# Patient Record
Sex: Female | Born: 1995 | Hispanic: Yes | Marital: Married | Smoking: Never smoker
Health system: Southern US, Community
[De-identification: ages and names within clinical notes are randomized; demographics above are authoritative.]

## PROBLEM LIST (undated history)

## (undated) DIAGNOSIS — D649 Anemia, unspecified: Secondary | ICD-10-CM

## (undated) HISTORY — DX: Anemia, unspecified: D64.9

---
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
## 2016-08-14 ENCOUNTER — Other Ambulatory Visit: Payer: Self-pay | Admitting: Physician Assistant

## 2016-08-14 DIAGNOSIS — Z3483 Encounter for supervision of other normal pregnancy, third trimester: Secondary | ICD-10-CM

## 2016-08-22 ENCOUNTER — Other Ambulatory Visit: Payer: Self-pay | Admitting: Physician Assistant

## 2016-08-22 ENCOUNTER — Ambulatory Visit
Admission: RE | Admit: 2016-08-22 | Discharge: 2016-08-22 | Disposition: A | Discharge status: Home / Self Care | Payer: Medicaid Other | Source: Ambulatory Visit | Attending: Physician Assistant | Admitting: Physician Assistant

## 2016-08-22 DIAGNOSIS — Z3483 Encounter for supervision of other normal pregnancy, third trimester: Secondary | ICD-10-CM | POA: Diagnosis present

## 2016-08-22 DIAGNOSIS — Z3A30 30 weeks gestation of pregnancy: Secondary | ICD-10-CM | POA: Insufficient documentation

## 2016-10-07 ENCOUNTER — Other Ambulatory Visit: Payer: Self-pay | Admitting: Advanced Practice Midwife

## 2016-10-07 DIAGNOSIS — Z3483 Encounter for supervision of other normal pregnancy, third trimester: Secondary | ICD-10-CM

## 2016-10-09 ENCOUNTER — Other Ambulatory Visit: Payer: Self-pay | Admitting: Advanced Practice Midwife

## 2016-10-10 ENCOUNTER — Other Ambulatory Visit: Payer: Self-pay | Admitting: Advanced Practice Midwife

## 2016-10-10 ENCOUNTER — Ambulatory Visit: Admission: RE | Admit: 2016-10-10 | Payer: Self-pay | Source: Ambulatory Visit

## 2016-10-10 DIAGNOSIS — O26843 Uterine size-date discrepancy, third trimester: Secondary | ICD-10-CM

## 2016-10-10 DIAGNOSIS — Z3483 Encounter for supervision of other normal pregnancy, third trimester: Secondary | ICD-10-CM

## 2016-10-23 ENCOUNTER — Inpatient Hospital Stay
Admission: EM | Admit: 2016-10-23 | Discharge: 2016-10-25 | DRG: 775 | Disposition: A | Discharge status: Home / Self Care | Payer: Medicaid Other | Attending: Obstetrics and Gynecology | Admitting: Obstetrics and Gynecology

## 2016-10-23 DIAGNOSIS — O9902 Anemia complicating childbirth: Principal | ICD-10-CM | POA: Diagnosis present

## 2016-10-23 DIAGNOSIS — D649 Anemia, unspecified: Secondary | ICD-10-CM | POA: Diagnosis present

## 2016-10-23 DIAGNOSIS — Z3A39 39 weeks gestation of pregnancy: Secondary | ICD-10-CM

## 2016-10-23 NOTE — OB Triage Note (Signed)
Patient presented to L&D complaining of contractions every 5-10 minutes since this morning. Patient also stated she is leaking some yellow fluid.  Denies any vaginal bleeding or decreased fetal movement.

## 2016-10-24 ENCOUNTER — Encounter: Payer: Self-pay | Admitting: Anesthesiology

## 2016-10-24 ENCOUNTER — Inpatient Hospital Stay: Payer: Medicaid Other | Admitting: Anesthesiology

## 2016-10-24 DIAGNOSIS — Z3493 Encounter for supervision of normal pregnancy, unspecified, third trimester: Secondary | ICD-10-CM | POA: Diagnosis present

## 2016-10-24 DIAGNOSIS — D649 Anemia, unspecified: Secondary | ICD-10-CM | POA: Diagnosis present

## 2016-10-24 DIAGNOSIS — O9902 Anemia complicating childbirth: Secondary | ICD-10-CM | POA: Diagnosis present

## 2016-10-24 DIAGNOSIS — Z3A39 39 weeks gestation of pregnancy: Secondary | ICD-10-CM | POA: Diagnosis not present

## 2016-10-24 LAB — CBC
HCT: 33.7 % — ABNORMAL LOW (ref 35.0–47.0)
Hemoglobin: 11.1 g/dL — ABNORMAL LOW (ref 12.0–16.0)
MCH: 23.8 pg — AB (ref 26.0–34.0)
MCHC: 33 g/dL (ref 32.0–36.0)
MCV: 72.3 fL — ABNORMAL LOW (ref 80.0–100.0)
PLATELETS: 412 10*3/uL (ref 150–440)
RBC: 4.66 MIL/uL (ref 3.80–5.20)
RDW: 16.8 % — ABNORMAL HIGH (ref 11.5–14.5)
WBC: 12.9 10*3/uL — ABNORMAL HIGH (ref 3.6–11.0)

## 2016-10-24 LAB — CHLAMYDIA/NGC RT PCR (ARMC ONLY)
Chlamydia Tr: NOT DETECTED
N GONORRHOEAE: NOT DETECTED

## 2016-10-24 LAB — TYPE AND SCREEN
ABO/RH(D): O POS
ANTIBODY SCREEN: NEGATIVE

## 2016-10-24 MED ORDER — AMMONIA AROMATIC IN INHA
RESPIRATORY_TRACT | Status: AC
  Filled 2016-10-24: qty 10

## 2016-10-24 MED ORDER — BUTORPHANOL TARTRATE 1 MG/ML IJ SOLN: 1.0000 mg | INTRAMUSCULAR | Status: DC | PRN

## 2016-10-24 MED ORDER — ACETAMINOPHEN 325 MG PO TABS: 650.0000 mg | ORAL_TABLET | ORAL | Status: DC | PRN

## 2016-10-24 MED ORDER — MISOPROSTOL 200 MCG PO TABS
ORAL_TABLET | ORAL | Status: AC
  Filled 2016-10-24: qty 4

## 2016-10-24 MED ORDER — FENTANYL 2.5 MCG/ML W/ROPIVACAINE 0.2% IN NS 100 ML EPIDURAL INFUSION (ARMC-ANES)
10.0000 mL/h | EPIDURAL | Status: DC
  Administered 2016-10-24: 10 mL/h via EPIDURAL

## 2016-10-24 MED ORDER — ONDANSETRON HCL 4 MG/2ML IJ SOLN: 4.0000 mg | Freq: Four times a day (QID) | INTRAMUSCULAR | Status: DC | PRN

## 2016-10-24 MED ORDER — WITCH HAZEL-GLYCERIN EX PADS: 1.0000 "application " | MEDICATED_PAD | CUTANEOUS | Status: DC | PRN

## 2016-10-24 MED ORDER — OXYCODONE-ACETAMINOPHEN 5-325 MG PO TABS
1.0000 | ORAL_TABLET | ORAL | Status: DC | PRN
  Administered 2016-10-24 – 2016-10-25 (×4): 1 via ORAL
  Filled 2016-10-24 (×3): qty 1

## 2016-10-24 MED ORDER — OXYTOCIN BOLUS FROM INFUSION: 500.0000 mL | Freq: Once | INTRAVENOUS | Status: DC

## 2016-10-24 MED ORDER — SIMETHICONE 80 MG PO CHEW: 80.0000 mg | CHEWABLE_TABLET | ORAL | Status: DC | PRN

## 2016-10-24 MED ORDER — LIDOCAINE-EPINEPHRINE (PF) 1.5 %-1:200000 IJ SOLN
INTRAMUSCULAR | Status: DC | PRN
  Administered 2016-10-24: 3 mL via EPIDURAL

## 2016-10-24 MED ORDER — PRENATAL MULTIVITAMIN CH
1.0000 | ORAL_TABLET | Freq: Every day | ORAL | Status: DC
  Administered 2016-10-24 – 2016-10-25 (×2): 1 via ORAL
  Filled 2016-10-24 (×3): qty 1

## 2016-10-24 MED ORDER — BENZOCAINE-MENTHOL 20-0.5 % EX AERO: 1.0000 "application " | INHALATION_SPRAY | CUTANEOUS | Status: DC | PRN

## 2016-10-24 MED ORDER — FENTANYL 2.5 MCG/ML W/ROPIVACAINE 0.2% IN NS 100 ML EPIDURAL INFUSION (ARMC-ANES)
EPIDURAL | Status: AC
  Filled 2016-10-24: qty 100

## 2016-10-24 MED ORDER — SODIUM CHLORIDE 0.9 % IV SOLN
INTRAVENOUS | Status: DC | PRN
  Administered 2016-10-24 (×2): 4 mL via EPIDURAL

## 2016-10-24 MED ORDER — LIDOCAINE HCL (PF) 1 % IJ SOLN: 30.0000 mL | INTRAMUSCULAR | Status: DC | PRN

## 2016-10-24 MED ORDER — SENNOSIDES-DOCUSATE SODIUM 8.6-50 MG PO TABS: 2.0000 | ORAL_TABLET | ORAL | Status: DC

## 2016-10-24 MED ORDER — SOD CITRATE-CITRIC ACID 500-334 MG/5ML PO SOLN: 30.0000 mL | ORAL | Status: DC | PRN

## 2016-10-24 MED ORDER — SODIUM CHLORIDE FLUSH 0.9 % IV SOLN
INTRAVENOUS | Status: AC
  Filled 2016-10-24: qty 30

## 2016-10-24 MED ORDER — DIBUCAINE 1 % RE OINT: 1.0000 "application " | TOPICAL_OINTMENT | RECTAL | Status: DC | PRN

## 2016-10-24 MED ORDER — IBUPROFEN 600 MG PO TABS
600.0000 mg | ORAL_TABLET | Freq: Four times a day (QID) | ORAL | Status: DC
  Administered 2016-10-24 – 2016-10-25 (×6): 600 mg via ORAL
  Filled 2016-10-24 (×6): qty 1

## 2016-10-24 MED ORDER — OXYCODONE-ACETAMINOPHEN 5-325 MG PO TABS
2.0000 | ORAL_TABLET | ORAL | Status: DC | PRN
  Filled 2016-10-24 (×2): qty 2

## 2016-10-24 MED ORDER — DIPHENHYDRAMINE HCL 25 MG PO CAPS: 25.0000 mg | ORAL_CAPSULE | Freq: Four times a day (QID) | ORAL | Status: DC | PRN

## 2016-10-24 MED ORDER — LIDOCAINE HCL (PF) 1 % IJ SOLN
INTRAMUSCULAR | Status: AC
  Filled 2016-10-24: qty 30

## 2016-10-24 MED ORDER — ONDANSETRON HCL 4 MG/2ML IJ SOLN
4.0000 mg | INTRAMUSCULAR | Status: DC | PRN
Start: 2016-10-24 — End: 2016-10-25
  Administered 2016-10-24: 4 mg via INTRAVENOUS
  Filled 2016-10-24: qty 2

## 2016-10-24 MED ORDER — OXYTOCIN 40 UNITS IN LACTATED RINGERS INFUSION - SIMPLE MED
2.5000 [IU]/h | INTRAVENOUS | Status: DC
  Filled 2016-10-24 (×2): qty 1000

## 2016-10-24 MED ORDER — ONDANSETRON HCL 4 MG PO TABS: 4.0000 mg | ORAL_TABLET | ORAL | Status: DC | PRN

## 2016-10-24 MED ORDER — LACTATED RINGERS IV SOLN: 500.0000 mL | INTRAVENOUS | Status: DC | PRN

## 2016-10-24 MED ORDER — OXYTOCIN 10 UNIT/ML IJ SOLN
INTRAMUSCULAR | Status: AC
  Filled 2016-10-24: qty 2

## 2016-10-24 MED ORDER — COCONUT OIL OIL: 1.0000 "application " | TOPICAL_OIL | Status: DC | PRN

## 2016-10-24 MED ORDER — LIDOCAINE HCL (PF) 1 % IJ SOLN
INTRAMUSCULAR | Status: DC | PRN
  Administered 2016-10-24: 4 mL via SUBCUTANEOUS

## 2016-10-24 MED ORDER — LACTATED RINGERS IV SOLN: INTRAVENOUS | Status: DC

## 2016-10-24 NOTE — Anesthesia Procedure Notes (Signed)
Epidural Patient location during procedure: OB Start time: 10/24/2016 2:45 AM End time: 10/24/2016 2:59 AM  Staffing Anesthesiologist: Lenard SimmerKARENZ, Kamaree Wheatley Performed: anesthesiologist   Preanesthetic Checklist Completed: patient identified, site marked, surgical consent, pre-op evaluation, timeout performed, IV checked, risks and benefits discussed and monitors and equipment checked  Epidural Patient position: sitting Prep: ChloraPrep Patient monitoring: heart rate, continuous pulse ox and blood pressure Approach: midline Location: L4-L5 Injection technique: LOR saline  Needle:  Needle type: Tuohy  Needle gauge: 17 G Needle length: 9 cm and 9 Needle insertion depth: 4.5 cm Catheter type: closed end flexible Catheter size: 19 Gauge Catheter at skin depth: 9 cm Test dose: negative and 1.5% lidocaine with Epi 1:200 K  Assessment Sensory level: T10 Events: blood not aspirated, injection not painful, no injection resistance, negative IV test and no paresthesia  Additional Notes Pt. Evaluated and documentation done after procedure finished. Patient identified. Risks/Benefits/Options discussed with patient including but not limited to bleeding, infection, nerve damage, paralysis, failed block, incomplete pain control, headache, blood pressure changes, nausea, vomiting, reactions to medication both or allergic, itching and postpartum back pain. Confirmed with bedside nurse the patient's most recent platelet count. Confirmed with patient that they are not currently taking any anticoagulation, have any bleeding history or any family history of bleeding disorders. Patient expressed understanding and wished to proceed. All questions were answered. Sterile technique was used throughout the entire procedure. Please see nursing notes for vital signs. Test dose was given through epidural catheter and negative prior to continuing to dose epidural or start infusion. Warning signs of high block given to the  patient including shortness of breath, tingling/numbness in hands, complete motor block, or any concerning symptoms with instructions to call for help. Patient was given instructions on fall risk and not to get out of bed. All questions and concerns addressed with instructions to call with any issues or inadequate analgesia.   Patient tolerated the insertion well without immediate complications.Reason for block:procedure for pain

## 2016-10-24 NOTE — Anesthesia Preprocedure Evaluation (Addendum)
Anesthesia Evaluation  Patient identified by MRN, date of birth, ID band Patient awake    Reviewed: Allergy & Precautions, H&P , NPO status , Patient's Chart, lab work & pertinent test results, reviewed documented beta blocker date and time   History of Anesthesia Complications Negative for: history of anesthetic complications  Airway Mallampati: II  TM Distance: >3 FB Neck ROM: full    Dental no notable dental hx. (+) Teeth Intact   Pulmonary neg pulmonary ROS,           Cardiovascular Exercise Tolerance: Good negative cardio ROS       Neuro/Psych negative neurological ROS  negative psych ROS   GI/Hepatic negative GI ROS, Neg liver ROS,   Endo/Other  negative endocrine ROS  Renal/GU Renal disease (Kidney stone)  negative genitourinary   Musculoskeletal   Abdominal   Peds  Hematology negative hematology ROS (+)   Anesthesia Other Findings History reviewed. No pertinent past medical history.   Reproductive/Obstetrics (+) Pregnancy                            Anesthesia Physical Anesthesia Plan  ASA: II  Anesthesia Plan: Epidural   Post-op Pain Management:    Induction:   Airway Management Planned:   Additional Equipment:   Intra-op Plan:   Post-operative Plan:   Informed Consent: I have reviewed the patients History and Physical, chart, labs and discussed the procedure including the risks, benefits and alternatives for the proposed anesthesia with the patient or authorized representative who has indicated his/her understanding and acceptance.   Dental Advisory Given  Plan Discussed with: Anesthesiologist, CRNA and Surgeon  Anesthesia Plan Comments:         Anesthesia Quick Evaluation

## 2016-10-24 NOTE — Discharge Summary (Signed)
Obstetric Discharge Summary Reason for Admission: onset of labor Prenatal Procedures: none Intrapartum Procedures: spontaneous vaginal delivery Postpartum Procedures: none Complications-Operative and Postpartum: none Hemoglobin  Date Value Ref Range Status  10/25/2016 8.6 (L) 12.0 - 16.0 g/dL Final    Comment:    RESULT REPEATED AND VERIFIED   HGB  Date Value Ref Range Status  06/09/2014 10.9 (L) 12.0 - 16.0 g/dL Final   HCT  Date Value Ref Range Status  10/25/2016 26.5 (L) 35.0 - 47.0 % Final  06/09/2014 33.6 (L) 35.0 - 47.0 % Final    Physical Exam:  General: alert, appears stated age and no distress Lochia: appropriate Uterine Fundus: firm DVT Evaluation: No evidence of DVT seen on physical exam.  Discharge Diagnoses: Term Pregnancy-delivered  Discharge Information: Date: 10/25/2016 Activity: pelvic rest Diet: routine   Medication List    TAKE these medications   ferrous sulfate 325 (65 FE) MG EC tablet Take 325 mg by mouth 2 (two) times daily with a meal.   multivitamin-prenatal 27-0.8 MG Tabs tablet Take 1 tablet by mouth daily at 12 noon.      Plans: IUD for birth control Condition: stable Discharge to: home Follow-up Information    [facility]   Why:  postpartum visit Contact information: [address] [place] [id number] [phone]           Newborn Data: Live born female    APGAR: 169,9   Home with mother.  [name] 10/25/2016, 9:06 AM

## 2016-10-24 NOTE — H&P (Signed)
Obstetric H&P   Chief Complaint: Contractions   Prenatal Care Provider: [facility]  History of Present Illness: 20 y.o. G3P2 61w3dby 30 weeks [place] EDC of 10/28/2016 presenting to L&D with contractions.  4/90/-1 on presentation with change to 5-6 in the first hour.  +FM, no LOF, some blood show.    PNC at [facility] notable for late entry to care and anemia with Hgb of 10.4.    ABO, Rh: O pos  Antibody: negative Rubella: Immune by documented MMR x 2 Varicella: Immune RPR: NR HBsAg: negative  HIV: negative 1-hr: not in records GBS: negative  Review of Systems: 10 point review of systems negative unless otherwise noted in HPI  Past Medical History: No past medical history on file.  Past Surgical History: No past surgical history on file.  Past Obstetric History:  Past Gynecologic History:  Family History: No family history on file.  Social History: Social History   Social History  . Marital status: Married    Spouse name: N/A  . Number of children: N/A  . Years of education: N/A   Occupational History  . Not on file.   Social History Main Topics  . Smoking status: Never Smoker  . Smokeless tobacco: Never Used  . Alcohol use No  . Drug use: No  . Sexual activity: Not on file   Other Topics Concern  . Not on file   Social History Narrative  . No narrative on file    Medications: Prior to Admission medications   Medication Sig Start Date End Date Taking? Authorizing Provider  ferrous sulfate 325 (65 FE) MG EC tablet Take 325 mg by mouth 2 (two) times daily with a meal.   Yes Historical Provider, MD  Prenatal Vit-Fe Fumarate-FA (MULTIVITAMIN-PRENATAL) 27-0.8 MG TABS tablet Take 1 tablet by mouth daily at 12 noon.   Yes Historical Provider, MD    Allergies: No Known Allergies  Physical Exam: Vitals: There were no vitals taken for this visit.  Urine Dip Protein: N/A  FHT: 135, moderate, +accels, no decels Toco: q5-648m  General: NAD HEENT:  normocephalic, anicteric Pulmonary: no increased work of breathing Cardiovascular: RRR Abdomen: Gravid, non-tender Leopolds: vtx Genitourinary: 5-6/90/-1 Extremities: no edema  Labs: No results found for this or any previous visit (from the past 24 hour(s)).  Assessment: [age].o. G3P2 3933w3d 30 week [place] EDC of 10/28/2016 presenting in term labor  Plan: 1) Labor expectant management  2) Fetus - cat I tracing  3) PNL -O pos / ABSC neg / RI / VZI / HIV neg / HBsAg neg / RPR NR / GBS negative  4) TDAP - 10/07/2016  5) Disposition - pending delivery anticipate vaginal

## 2016-10-24 NOTE — Progress Notes (Signed)
Pt up to bathroom to void and peri care performed.  Pt in wheelchair to tranfer to mother/baby room 337.

## 2016-10-25 LAB — CBC
HEMATOCRIT: 26.5 % — AB (ref 35.0–47.0)
Hemoglobin: 8.6 g/dL — ABNORMAL LOW (ref 12.0–16.0)
MCH: 23.8 pg — ABNORMAL LOW (ref 26.0–34.0)
MCHC: 32.6 g/dL (ref 32.0–36.0)
MCV: 73 fL — AB (ref 80.0–100.0)
PLATELETS: 310 10*3/uL (ref 150–440)
RBC: 3.62 MIL/uL — AB (ref 3.80–5.20)
RDW: 16.9 % — ABNORMAL HIGH (ref 11.5–14.5)
WBC: 11.5 10*3/uL — AB (ref 3.6–11.0)

## 2016-10-25 LAB — RPR: RPR Ser Ql: NONREACTIVE

## 2016-10-25 NOTE — Progress Notes (Signed)
Admit Date: 10/23/2016 Today's Date: 10/25/2016  Post Partum Day 1  Subjective:  no complaints  Objective: Temp:  [98.2 F (36.8 C)-98.9 F (37.2 C)] 98.2 F (36.8 C) (12/02 0806) Pulse Rate:  [68-91] 85 (12/02 0806) Resp:  [16-18] 16 (12/02 0806) BP: (84-115)/(48-81) 100/67 (12/02 0806) SpO2:  [99 %-100 %] 100 % (12/02 0806) Weight:  [137 lb (62.1 kg)] 137 lb (62.1 kg) (12/01 1741)  Physical Exam:  General: alert, cooperative and no distress Lochia: appropriate Uterine Fundus: firm Incision: none DVT Evaluation: No evidence of DVT seen on physical exam.   Recent Labs  10/24/16 0121 10/25/16 0519  HGB 11.1* 8.6*  HCT 33.7* 26.5*    Assessment/Plan: Discharge home, Breastfeeding and Infant doing well   LOS: 1 day   [name] 10/25/2016, 9:07 AM

## 2016-10-25 NOTE — Anesthesia Postprocedure Evaluation (Signed)
Anesthesia Post Note  Patient: [name]  Procedure(s) Performed: * No procedures listed *  Patient location during evaluation: Mother Baby Anesthesia Type: Epidural Level of consciousness: oriented and awake and alert Pain management: pain level controlled Vital Signs Assessment: post-procedure vital signs reviewed and stable Respiratory status: spontaneous breathing Cardiovascular status: blood pressure returned to baseline Postop Assessment: no headache and no backache Anesthetic complications: no    Last Vitals:  Vitals:   10/25/16 0806 10/25/16 1205  BP: 100/67 (!) 89/55  Pulse: 85 94  Resp: 16 20  Temp: 36.8 C 36.8 C    Last Pain:  Vitals:   10/25/16 1205  TempSrc: Oral  PainSc:                  Kylo Gavin

## 2016-10-25 NOTE — Progress Notes (Signed)
Patient discharged home with infant and family. Discharge instructions, prescriptions and follow up appointment given to and reviewed with patient and family. Patient verbalized understanding. Escorted out via wheelchair.

## 2020-01-06 ENCOUNTER — Telehealth: Payer: Self-pay | Admitting: Family Medicine

## 2020-01-06 NOTE — Telephone Encounter (Signed)
Wants nexplanon replaced

## 2020-01-09 NOTE — Telephone Encounter (Signed)
Call to client to discuss her request to have Nexplanon (inserted at [facility] 03/24/2017) replaced. Left message regarding insertion date and not due to be removed until at least 03/24/2020. Requested client return call so can discuss above / assess for problems / answer questions. Jossie Ng, RN

## 2020-01-09 NOTE — Telephone Encounter (Signed)
Return call from client who denies problems with Nexplanon. Encouraged client to call back in April regarding Nexplanon removal / reinsertion and she is agreeable with plan. Jossie Ng, RN

## 2020-03-14 ENCOUNTER — Telehealth: Payer: Self-pay | Admitting: Family Medicine

## 2020-03-14 NOTE — Telephone Encounter (Signed)
Returned patient call and explained that nexplanon is good for up to 4 years. Patient states she is uncomfortable leaving it in for more than 3 years. It was inserted 03/24/2017 per [facility] records. Patient states she does not want to be pregnant again now and would like removal/reinsertion. Chart forwarded to K. Rudd for procedure waiting list. Patient states understanding.Burt Knack, RN

## 2020-03-14 NOTE — Telephone Encounter (Signed)
TC to patient. OK to schedule for nexplanon removal/reinsertion per K. R[facility]. Patient needs PE, and removal/reinsertion, scheduled for 60 minutes appointment. Last PE (PP) 12/05/2016 and nexplanon insertion 03/24/2017 per [facility]. Will need Pap test as well per [facility]. Patient scheduled for 03/29/2020 and counseled to arrive at 2:30 for 3:00 appointment. Patient states understanding and denies further questions.Burt Knack, RN

## 2020-03-14 NOTE — Telephone Encounter (Signed)
Patient wants Nexplanon removed and reinsertion. 

## 2020-03-29 ENCOUNTER — Ambulatory Visit (LOCAL_COMMUNITY_HEALTH_CENTER): Payer: Self-pay | Admitting: Physician Assistant

## 2020-03-29 ENCOUNTER — Encounter: Payer: Self-pay | Admitting: Physician Assistant

## 2020-03-29 ENCOUNTER — Other Ambulatory Visit: Payer: Self-pay

## 2020-03-29 VITALS — BP 99/69 | Ht 59.0 in | Wt 147.2 lb

## 2020-03-29 DIAGNOSIS — Z3009 Encounter for other general counseling and advice on contraception: Secondary | ICD-10-CM

## 2020-03-29 NOTE — Progress Notes (Signed)
Family Planning Visit- Repeat Yearly Visit  Subjective:  [name] is a 24 y.o. G3P1003  being seen today for an well woman visit and to discuss family planning options.    She is currently using Nexplanon for pregnancy prevention. Patient reports she does not want a pregnancy in the next year. Patient  has Gall stones and Normal labor on their problem list.  Chief Complaint  Patient presents with  . Contraception    Initially patient reports she wants Nexplanon removal and reinsertion today.   Patient denies concerns - See Routine Health Survey.   See flowsheet for other program required questions.   Body mass index is 29.73 kg/m. - Patient is eligible for diabetes screening based on BMI and age >57?  no HA1C ordered? not applicable  Patient reports 1 of partners in last year. Desires STI screening?  Yes   Has patient been screened once for HCV in the past?  No  No results found for: HCVAB  Does the patient have current of drug use, have a partner with drug use, and/or has been incarcerated since last result? No  If yes-- Screen for HCV through [facility]   Does the patient meet criteria for HBV testing? No  Criteria:  -Household, sexual or needle sharing contact with HBV -History of drug use -HIV positive -Those with known Hep C   Health Maintenance Due  Topic Date Due  . HIV Screening  Never done  . TETANUS/TDAP  Never done  . PAP-Cervical Cytology Screening  Never done  . PAP SMEAR-Modifier  Never done    ROS See Routine Health Survey  The following portions of the patient's history were reviewed and updated as appropriate: allergies, current medications, past family history, past medical history, past social history, past surgical history and problem list. Problem list updated.  Objective:   Vitals:   03/29/20 1521  BP: 99/69  Weight: 147 lb 3.2 oz (66.8 kg)  Height: 4\' 11"  (1.499 m)    Physical Exam Constitutional:      Appearance:  Normal appearance.  HENT:     Head: Normocephalic and atraumatic.     Mouth/Throat:     Mouth: Mucous membranes are moist.     Pharynx: Oropharynx is clear. No oropharyngeal exudate.  Cardiovascular:     Rate and Rhythm: Normal rate and regular rhythm.     Heart sounds: Normal heart sounds. No murmur.  Pulmonary:     Effort: Pulmonary effort is normal.     Breath sounds: Normal breath sounds.  Chest:     Breasts:        Right: Normal. No inverted nipple, nipple discharge or skin change.        Left: Normal. No inverted nipple, nipple discharge or skin change.  Abdominal:     Palpations: Abdomen is soft.     Tenderness: There is no abdominal tenderness. There is no guarding.  Genitourinary:    General: Normal vulva.     Exam position: Lithotomy position.     Pubic Area: No rash.      Labia:        Right: No rash, tenderness or lesion.        Left: No rash, tenderness or lesion.      Urethra: No urethral swelling.     Vagina: No signs of injury. No tenderness, bleeding or lesions.     Cervix: No cervical motion tenderness, friability or lesion.     Uterus: Not enlarged and  not tender.      Adnexa:        Right: No tenderness.         Left: No tenderness.       Rectum: No external hemorrhoid.  Musculoskeletal:        General: Normal range of motion.     Cervical back: Normal range of motion.  Lymphadenopathy:     Cervical: No cervical adenopathy.     Upper Body:     Right upper body: No supraclavicular or axillary adenopathy.     Left upper body: No supraclavicular or axillary adenopathy.     Lower Body: No right inguinal adenopathy. No left inguinal adenopathy.  Skin:    General: Skin is warm and dry.     Findings: No rash.  Neurological:     Mental Status: She is alert and oriented to person, place, and time.  Psychiatric:        Behavior: Behavior normal.        Thought Content: Thought content normal.        Judgment: Judgment normal.       Assessment and  Plan:  [name] is a 24 y.o. female G3P1003 presenting to the [facility] Department for an yearly well woman exam/family planning visit  Contraception counseling: Reviewed all forms of birth control options in the tiered based approach. available including abstinence; over the counter/barrier methods; hormonal contraceptive medication including pill, patch, ring, injection,contraceptive implant, ECP; hormonal and nonhormonal IUDs; permanent sterilization options including vasectomy and the various tubal sterilization modalities. Risks, benefits, and typical effectiveness rates were reviewed.  Questions were answered.  Written information was also given to the patient to review.  Counseled that Nexplanon contraceptive effectiveness will continue for an additional 4th year. Patient desires to continue Nexplanon and replacement early May 2022. She will follow up in  12 mo for surveillance.  She was told to call with any further questions, or with any concerns about this method of contraception.  Emphasized use of condoms 100% of the time for STI prevention.  Patient was not offered ECP.   1. Family planning services Pt elects to await Nexplanon replacement until next year. First Pap done today. HIV, syphilis, GC, Chlam testing done today for STI screening.  Return in about 1 year (around 03/29/2021) for Annual well-woman exam, Nexplanon removal/replacement.  No future appointments.  Landry Dyke, PA-C

## 2020-03-29 NOTE — Progress Notes (Signed)
In for Nexplanon removal/reinsertion; desires HIV/RPR testing Sharlette Dense, RN

## 2020-04-03 LAB — PAP IG (IMAGE GUIDED): PAP Smear Comment: 0

## 2020-04-05 NOTE — Progress Notes (Signed)
Client is not pregnant. Therefore, OB abstraction is not required. Jossie Ng, RN

## 2020-04-06 ENCOUNTER — Telehealth: Payer: Self-pay

## 2020-04-06 DIAGNOSIS — A749 Chlamydial infection, unspecified: Secondary | ICD-10-CM

## 2020-04-06 NOTE — Telephone Encounter (Signed)
TC to patient. Verified ID via password/SS#. Informed of positive chlamydia and need for tx. Instructed to eat before visit and have partner call for tx appt. Appt scheduled.Ayshia Gramlich, RN    

## 2020-04-12 ENCOUNTER — Telehealth: Payer: Self-pay

## 2020-04-16 NOTE — Telephone Encounter (Signed)
TC with patient. Rescheduled tx appt for tomorrow morning. Richmond Campbell, RN

## 2020-04-30 ENCOUNTER — Telehealth: Payer: Self-pay | Admitting: Family Medicine

## 2020-04-30 NOTE — Telephone Encounter (Signed)
TC to patient to reschedule missed appointments for treatment. Schedule for 05/01/20.

## 2020-11-07 ENCOUNTER — Ambulatory Visit (LOCAL_COMMUNITY_HEALTH_CENTER): Payer: Self-pay | Admitting: Physician Assistant

## 2020-11-07 ENCOUNTER — Other Ambulatory Visit: Payer: Self-pay

## 2020-11-07 VITALS — BP 106/69 | Ht 60.0 in | Wt 145.0 lb

## 2020-11-07 DIAGNOSIS — Z3161 Procreative counseling and advice using natural family planning: Secondary | ICD-10-CM

## 2020-11-07 DIAGNOSIS — Z3046 Encounter for surveillance of implantable subdermal contraceptive: Secondary | ICD-10-CM

## 2020-11-07 DIAGNOSIS — Z3009 Encounter for other general counseling and advice on contraception: Secondary | ICD-10-CM

## 2020-11-07 NOTE — Progress Notes (Signed)
Patient here for nexplanon removal. Patient requesting preconception counseling.   Harvie Heck, RN

## 2020-11-08 NOTE — Progress Notes (Signed)
[facility] problem visit  [facility]  Subjective:  [name] is a 24 y.o. being seen today for Nexplanon removal.  Chief Complaint  Patient presents with  . Contraception    Nexplanon removal    HPI  Patient states that she has had her Nexplanon for 4 years and would like a removal so that she can achieve pregnancy.  Denies problems with the device.  Denies any concerns today.    Does the patient have a current or past history of drug use? No   No components found for: HCV]   Health Maintenance Due  Topic Date Due  . Hepatitis C Screening  Never done  . HIV Screening  Never done  . TETANUS/TDAP  Never done  . INFLUENZA VACCINE  Never done    Review of Systems  All other systems reviewed and are negative.   The following portions of the patient's history were reviewed and updated as appropriate: allergies, current medications, past family history, past medical history, past social history, past surgical history and problem list. Problem list updated.   See flowsheet for other program required questions.  Objective:   Vitals:   11/07/20 1309  BP: 106/69  Weight: 145 lb (65.8 kg)  Height: 5' (1.524 m)    Physical Exam Vitals and nursing note reviewed.  Constitutional:      General: She is not in acute distress.    Appearance: Normal appearance. She is normal weight.  HENT:     Head: Normocephalic and atraumatic.  Eyes:     Conjunctiva/sclera: Conjunctivae normal.  Pulmonary:     Effort: Pulmonary effort is normal.  Skin:    General: Skin is warm and dry.  Neurological:     Mental Status: She is alert and oriented to person, place, and time.  Psychiatric:        Mood and Affect: Mood normal.        Behavior: Behavior normal.        Thought Content: Thought content normal.        Judgment: Judgment normal.       Assessment and Plan:  [name] is a 24 y.o. female  presenting to the [facility] Department for a Women's Health problem visit  1. Encounter for counseling regarding contraception Counseled patient that she could become pregnant before she has a period after Nexplanon removal. Counseled that she may have a period in a few days or about 4-6 weeks. Enc condoms with all sex if changes mind re: getting pregnant ASAP or decides she wants to try other method.  2. Nexplanon removal Nexplanon Removal Patient identified, informed consent performed, consent signed.   Appropriate time out taken. Nexplanon site identified.  Area prepped in usual sterile fashon. 2 ml of 1% lidocaine with Epinephrine was used to anesthetize the area at the distal end of the implant and along implant site. A small stab incision was made right beside the implant on the distal portion.  The Nexplanon rod was grasped manualy and removed without difficulty.  There was minimal blood loss. There were no complications.  Steri-strips were applied over the small incision.  A pressure bandage was applied to reduce any bruising.  The patient tolerated the procedure well and was given post procedure instructions.   Nexplanon:   Counseled patient to take OTC analgesic starting as soon as lidocaine starts to wear off and take regularly for at least 48 hr to decrease  discomfort.  Specifically to take with food or milk to decrease stomach upset and for IB 600 mg (3 tablets) every 6 hrs; IB 800 mg (4 tablets) every 8 hrs; or Aleve 2 tablets every 12 hrs.    3. Procreative counseling and advice using natural family planning Reviewed with patient healthy habits for her and partner for healthy selves and pregnancy. Enc regular sleep schedule, regular, healthy diet and exercise, avoid tobacco, EtOH, excess caffeine and other drugs. Enc to start/continue with MVI or PNV 1 po daily for folic acid. Call with questions or concerns.     No follow-ups on file.  No future  appointments.  Matt Holmes, PA

## 2020-12-13 ENCOUNTER — Telehealth: Payer: Self-pay | Admitting: Family Medicine

## 2020-12-13 NOTE — Telephone Encounter (Signed)
PATIENT WANTS TO GET IUD INSERTION, ALSO NEEDS A PHYSICAL APPT.

## 2020-12-14 NOTE — Telephone Encounter (Signed)
Patient already scheduled for nexplanon insertion. Patient reminded no unprotected sex/ sex without condom until appt.   Harvie Heck, RN

## 2020-12-21 ENCOUNTER — Ambulatory Visit (LOCAL_COMMUNITY_HEALTH_CENTER): Payer: Self-pay | Admitting: Advanced Practice Midwife

## 2020-12-21 ENCOUNTER — Other Ambulatory Visit: Payer: Self-pay

## 2020-12-21 VITALS — BP 124/78 | HR 99 | Ht 60.0 in | Wt 140.0 lb

## 2020-12-21 DIAGNOSIS — Z3009 Encounter for other general counseling and advice on contraception: Secondary | ICD-10-CM

## 2020-12-21 DIAGNOSIS — E663 Overweight: Secondary | ICD-10-CM | POA: Insufficient documentation

## 2020-12-21 DIAGNOSIS — Z30011 Encounter for initial prescription of contraceptive pills: Secondary | ICD-10-CM

## 2020-12-21 LAB — PREGNANCY, URINE: Preg Test, Ur: NEGATIVE

## 2020-12-21 MED ORDER — NORGESTIM-ETH ESTRAD TRIPHASIC 0.18/0.215/0.25 MG-35 MCG PO TABS: 1.0000 | ORAL_TABLET | Freq: Every day | ORAL | 0 refills | Status: DC

## 2020-12-21 MED ORDER — LEVONORGESTREL 1.5 MG PO TABS: 1.5000 mg | ORAL_TABLET | Freq: Once | ORAL | 0 refills | Status: AC

## 2020-12-21 MED ORDER — LEVONORGESTREL 1.5 MG PO TABS: 1.5000 mg | ORAL_TABLET | Freq: Once | ORAL | Status: DC

## 2020-12-21 NOTE — Progress Notes (Signed)
Presents for Nexplanon Insertion. Nexplanon removed on 11/07/20, no other birth control used since. Pt requests UPT d/t LMP of 11/07/20 and sex without condom 2 days ago. Reports Postive home PT on 12/17/20, and Negative home PT on 12/19/20. UPT Negative today in clinic, provider reviewed. Plan B dispensed, counseled. 1 pack of TriSprintec dispensed, counseled. Condoms given. Nexplanon insertion apt scheduled for 01/08/21 with UPT. Sharlyne Pacas, RN

## 2020-12-21 NOTE — Progress Notes (Signed)
Contraception/Family Planning VISIT ENCOUNTER NOTE  Subjective:   [name] is a 25 y.o. nonsmoker MHF G3P1003 (6,5,3)  female here for reproductive life counseling.  Desires Nexplanon for BCM.  Reports she does not want a pregnancy in the next year. Denies abnormal vaginal bleeding, discharge, pelvic pain, problems with intercourse or other gynecologic concerns. Pt had Nexplanon removed on 11/07/20 and refused reinsertion of Nexplanon at that time. LMP 11/07/20.  Pt did home PT on 12/17/20=+.  Pt did home PT on 12/19/20=neg.  Pt states she has had unprotected sex 2-3x/wk since LMP with last sex 12/18/20 without condom; with current partner x 7 years; 1 partner in last 3 mo.     Gynecologic History Patient's last menstrual period was 11/07/2020 (approximate). Contraception: none  Health Maintenance Due  Topic Date Due  . Hepatitis C Screening  Never done  . HIV Screening  Never done  . TETANUS/TDAP  Never done  . INFLUENZA VACCINE  Never done     The following portions of the patient's history were reviewed and updated as appropriate: allergies, current medications, past family history, past medical history, past social history, past surgical history and problem list.  Review of Systems Pertinent items are noted in HPI.   Objective:  BP 124/78   Pulse 99   Ht 5' (1.524 m)   Wt 140 lb (63.5 kg)   LMP 11/07/2020 (Approximate)   BMI 27.34 kg/m  Gen: well appearing, NAD HEENT: no scleral icterus CV: RR Lung: Normal WOB Ext: warm well perfused     Assessment and Plan:   Contraception counseling: Reviewed all forms of birth control options in the tiered based approach. available including abstinence; over the counter/barrier methods; hormonal contraceptive medication including pill, patch, ring, injection,contraceptive implant, ECP; hormonal and nonhormonal IUDs; permanent sterilization options including vasectomy and the various tubal sterilization modalities.  Risks, benefits, and typical effectiveness rates were reviewed.  Questions were answered.  Written information was also given to the patient to review.  Patient desires Nexplanon, this was not prescribed for patient. She will follow up in 2 wks for surveillance and Nexplanon insertion.  She was told to call with any further questions, or with any concerns about this method of contraception.  Emphasized use of condoms 100% of the time for STI prevention.  Patient was offered ECP. ECP was accepted by the patient. ECP counseling was given - see RN documentation  1. Encounter for counseling regarding contraception Pt accepts Plan B today; PT today=neg - Pregnancy, urine  2. Family planning Please give pt condoms. Please assist pt with return apt on 01/08/21 for Nexplanon insertion if PT neg that day - levonorgestrel (PLAN B 1-STEP) tablet 1.5 mg - Norgestimate-Ethinyl Estradiol Triphasic (TRI-SPRINTEC) 0.18/0.215/0.25 MG-35 MCG tablet; Take 1 tablet by mouth daily for 1 day.  Dispense: 28 tablet; Refill: 0  3. Encounter for initial prescription of contraceptive pills Tri Sprintec #1 I po daily to begin tomorrow Please counsel on abstinance/back up condoms next 7 days    Please refer to After Visit Summary for other counseling recommendations.   No follow-ups on file.  Alberteen Spindle, CNM [facility] DEPARTMENT

## 2021-01-08 ENCOUNTER — Ambulatory Visit: Payer: Self-pay

## 2021-01-14 ENCOUNTER — Ambulatory Visit: Payer: Self-pay

## 2021-01-29 ENCOUNTER — Ambulatory Visit: Payer: Self-pay

## 2021-01-29 ENCOUNTER — Ambulatory Visit (LOCAL_COMMUNITY_HEALTH_CENTER): Payer: Self-pay | Admitting: Family Medicine

## 2021-01-29 ENCOUNTER — Other Ambulatory Visit: Payer: Self-pay

## 2021-01-29 VITALS — BP 95/65 | Ht 60.0 in | Wt 138.8 lb

## 2021-01-29 DIAGNOSIS — Z30017 Encounter for initial prescription of implantable subdermal contraceptive: Secondary | ICD-10-CM

## 2021-01-29 DIAGNOSIS — Z3009 Encounter for other general counseling and advice on contraception: Secondary | ICD-10-CM

## 2021-01-29 MED ORDER — ETONOGESTREL 68 MG ~~LOC~~ IMPL
68.0000 mg | DRUG_IMPLANT | Freq: Once | SUBCUTANEOUS | Status: AC
  Administered 2021-01-29: 68 mg via SUBCUTANEOUS

## 2021-01-29 NOTE — Progress Notes (Signed)
Patient here for nexplanon insertion. Was on OCP ut ran out a week ago. Last sex as week ago without condom. Patient started period yesterday. Harvie Heck, RN  Post:  Provider orders complete.  Harvie Heck, RN

## 2021-01-29 NOTE — Progress Notes (Signed)
S: Patient in clinic today for nexplanon insertion, was on OCP, last pill was taken 5 days ago.  Patient reports last sex was 01/21/21  when on OCP.     O: LMP Started 01/28/21, last Physical Exam was 11/07/20, and last PAP due 03/2023.    A: nexplanon inserted today  P: Nexplanon Insertion Procedure Patient identified, informed consent performed, consent signed.   Patient does understand that irregular bleeding is a very common side effect of this medication. She was advised to have backup contraception after placement. Patient was determined to meet WHO criteria for not being pregnant. Appropriate time out taken.  The insertion site was identified 8-10 cm (3-4 inches) from the medial epicondyle of the humerus and 3-5 cm (1.25-2 inches) posterior to (below) the sulcus (groove) between the biceps and triceps muscles of the patient's Left arm and marked.  Patient was prepped with alcohol swab and then injected with 3 ml of 1% lidocaine.  Arm was prepped with chlorhexidene, Nexplanon removed from packaging,  Device confirmed in needle, then inserted full length of needle and withdrawn per handbook instructions. Nexplanon was able to palpated in the patient's arm; patient palpated the insert herself. There was minimal blood loss.  Patient insertion site covered with guaze and a pressure bandage to reduce any bruising.  The patient tolerated the procedure well and was given post procedure instructions.    Counseled patient to take OTC analgesic starting as soon as lidocaine starts to wear off and take regularly for at least 48 hr to decrease discomfort.  Specifically to take with food or milk to decrease stomach upset and for IB 600 mg (3 tablets) every 6 hrs; IB 800 mg (4 tablets) every 8 hrs; or Aleve 2 tablets every 12 hrs.  Patient to call if any questions or concerns.   Wendi Snipes, FNP

## 2021-03-28 ENCOUNTER — Telehealth: Payer: Self-pay | Admitting: Family Medicine

## 2021-03-28 NOTE — Telephone Encounter (Signed)
Phone call to pt. Wants Nexplanon removed; Pt states her arm hurts and is concerned about wt gain. Pt counseled she will be due for physical. Scheduled physical and nexplanon removal together. Pt counseled to continue to abstain from sex (last sex was about 1 month ago). Desires OCP.

## 2021-03-28 NOTE — Telephone Encounter (Signed)
Patient called to schedule an appointment to remove her nexplanon. Thank you!!

## 2021-04-01 ENCOUNTER — Encounter: Payer: Self-pay | Admitting: Physician Assistant

## 2021-04-01 ENCOUNTER — Other Ambulatory Visit: Payer: Self-pay

## 2021-04-01 ENCOUNTER — Ambulatory Visit (LOCAL_COMMUNITY_HEALTH_CENTER): Payer: Self-pay | Admitting: Physician Assistant

## 2021-04-01 VITALS — BP 111/79 | Ht 60.0 in | Wt 139.2 lb

## 2021-04-01 DIAGNOSIS — Z0000 Encounter for general adult medical examination without abnormal findings: Secondary | ICD-10-CM

## 2021-04-01 DIAGNOSIS — Z3009 Encounter for other general counseling and advice on contraception: Secondary | ICD-10-CM

## 2021-04-01 DIAGNOSIS — Z30011 Encounter for initial prescription of contraceptive pills: Secondary | ICD-10-CM

## 2021-04-01 DIAGNOSIS — Z3046 Encounter for surveillance of implantable subdermal contraceptive: Secondary | ICD-10-CM

## 2021-04-01 MED ORDER — NORGESTIM-ETH ESTRAD TRIPHASIC 0.18/0.215/0.25 MG-35 MCG PO TABS: 1.0000 | ORAL_TABLET | Freq: Every day | ORAL | 13 refills | Status: DC

## 2021-04-03 ENCOUNTER — Encounter: Payer: Self-pay | Admitting: Physician Assistant

## 2021-04-03 NOTE — Progress Notes (Signed)
[facility] DEPARTMENT [facility] [address] Main Number: [phone]    Family Planning Visit- Initial Visit  Subjective:  [name] is a 25 y.o.  G3P1003   being seen today for an initial annual visit and to discuss contraceptive options.  The patient is currently using Nexplanon for pregnancy prevention. Patient reports she does not want a pregnancy in the next year.  Patient has the following medical conditions has Gall stones and Overweight BMI=27.3 on their problem list.  Chief Complaint  Patient presents with  . Contraception    Annual visit and discuss BCM/ Nexplanon removal    Patient reports that she would like Nexplanon removal and to start OCs.  States that she feels that she has gained weight with the Nexplanon and has also felt a pinching feeling at the site.  Per chart review, CBE and pap are due in 2024.  Patient denies any concerns today.    Body mass index is 27.19 kg/m. - Patient is eligible for diabetes screening based on BMI and age >97?  not applicable HA1C ordered? not applicable  Patient reports 1  partner/s in last year. Desires STI screening?  No - patient declines.  Has patient been screened once for HCV in the past?  No  No results found for: HCVAB  Does the patient have current drug use (including MJ), have a partner with drug use, and/or has been incarcerated since last result? No  If yes-- Screen for HCV through [facility]   Does the patient meet criteria for HBV testing? No  Criteria:  -Household, sexual or needle sharing contact with HBV -History of drug use -HIV positive -Those with known Hep C   Health Maintenance Due  Topic Date Due  . HPV VACCINES (1 - 2-dose series) Never done  . HIV Screening  Never done  . Hepatitis C Screening  Never done  . TETANUS/TDAP  Never done    Review of Systems  All other systems reviewed and are negative.   The following portions of  the patient's history were reviewed and updated as appropriate: allergies, current medications, past family history, past medical history, past social history, past surgical history and problem list. Problem list updated.   See flowsheet for other program required questions.  Objective:   Vitals:   04/01/21 1454  BP: 111/79  Weight: 139 lb 3.2 oz (63.1 kg)  Height: 5' (1.524 m)    Physical Exam Vitals and nursing note reviewed.  Constitutional:      General: She is not in acute distress.    Appearance: Normal appearance.  HENT:     Head: Normocephalic and atraumatic.     Mouth/Throat:     Mouth: Mucous membranes are moist.     Pharynx: Oropharynx is clear. No oropharyngeal exudate or posterior oropharyngeal erythema.  Eyes:     Conjunctiva/sclera: Conjunctivae normal.  Neck:     Thyroid: No thyroid mass, thyromegaly or thyroid tenderness.  Cardiovascular:     Rate and Rhythm: Normal rate and regular rhythm.  Pulmonary:     Effort: Pulmonary effort is normal.     Breath sounds: Normal breath sounds.  Abdominal:     Palpations: Abdomen is soft. There is no mass.     Tenderness: There is no abdominal tenderness. There is no guarding or rebound.  Musculoskeletal:     Cervical back: Neck supple. No tenderness.  Lymphadenopathy:     Cervical: No cervical adenopathy.  Skin:    General: Skin is warm and dry.     Findings: No bruising, erythema, lesion or rash.  Neurological:     Mental Status: She is alert and oriented to person, place, and time.  Psychiatric:        Mood and Affect: Mood normal.        Behavior: Behavior normal.        Thought Content: Thought content normal.        Judgment: Judgment normal.       Assessment and Plan:  [name] is a 25 y.o. female presenting to the [facility] Department for an initial annual wellness/contraceptive visit  Contraception counseling: Reviewed all forms of birth control options in the  tiered based approach. available including abstinence; over the counter/barrier methods; hormonal contraceptive medication including pill, patch, ring, injection,contraceptive implant, ECP; hormonal and nonhormonal IUDs; permanent sterilization options including vasectomy and the various tubal sterilization modalities. Risks, benefits, and typical effectiveness rates were reviewed.  Questions were answered.  Written information was also given to the patient to review.  Patient desires Nexplanon removal and to start OCs, this was prescribed for patient. She will follow up in  6 months for pill supply, 1 year for RP and prn  for surveillance.  She was told to call with any further questions, or with any concerns about this method of contraception.  Emphasized use of condoms 100% of the time for STI prevention.  Patient was not a candidate for ECP today.   1. Encounter for counseling regarding contraception Reviewed with patient normal SE of OCs and when to call clinic with concerns. Enc condoms with all sex, especially for 2 weeks after starting OCs, if late OC and always for STD protection.   2. Nexplanon removal Nexplanon Removal Patient identified, informed consent performed, consent signed.   Appropriate time out taken. Nexplanon site identified.  Area prepped in usual sterile fashon. 3 ml of 1% lidocaine with Epinephrine was used to anesthetize the area at the distal end of the implant and along implant site. A small stab incision was made right beside the implant on the distal portion.  The Nexplanon rod was grasped manually and removed without difficulty.  There was minimal blood loss. There were no complications.  Steri-strips were applied over the small incision.  A pressure bandage was applied to reduce any bruising.  The patient tolerated the procedure well and was given post procedure instructions.   Nexplanon:   Counseled patient to take OTC analgesic starting as soon as lidocaine starts to  wear off and take regularly for at least 48 hr to decrease discomfort.  Specifically to take with food or milk to decrease stomach upset and for IB 600 mg (3 tablets) every 6 hrs; IB 800 mg (4 tablets) every 8 hrs; or Aleve 2 tablets every 12 hrs.   3. Well woman exam (no gynecological exam) Reviewed with patient healthy habits to maintain general health. Enc MVI 1 po daily. Enc to establish with/ follow up with PCP for primary care concerns, age appropriate screenings and illness.  4. OCP (oral contraceptive pills) initiation OK to start Tri Sprintec 28 d 1 po daily at the same time each day today.  Patient given #6 packs of OC today due to supply available and expiration date and counseled to RTC in 6 months for balance of pill supply. - Norgestimate-Ethinyl Estradiol Triphasic (TRI-SPRINTEC) 0.18/0.215/0.25 MG-35 MCG tablet; Take 1 tablet by mouth daily.  Dispense:  28 tablet; Refill: 13     Return in about 6 months (around 10/02/2021) for MR, 1 yr for RP and prn.  No future appointments.  Matt Holmes, PA

## 2021-04-04 NOTE — Progress Notes (Signed)
Chart reviewed by Pharmacist  Suzanne Walker PharmD, Contract Pharmacist at [facility]  

## 2021-05-01 ENCOUNTER — Ambulatory Visit (LOCAL_COMMUNITY_HEALTH_CENTER): Payer: Self-pay

## 2021-05-01 ENCOUNTER — Other Ambulatory Visit: Payer: Self-pay

## 2021-05-01 VITALS — BP 102/72 | Ht 60.0 in | Wt 141.0 lb

## 2021-05-01 DIAGNOSIS — Z3009 Encounter for other general counseling and advice on contraception: Secondary | ICD-10-CM

## 2021-05-01 DIAGNOSIS — Z3201 Encounter for pregnancy test, result positive: Secondary | ICD-10-CM

## 2021-05-01 LAB — PREGNANCY, URINE: Preg Test, Ur: POSITIVE — AB

## 2021-05-01 MED ORDER — MEDROXYPROGESTERONE ACETATE 150 MG/ML IM SUSP: 150.0000 mg | INTRAMUSCULAR | Status: DC

## 2021-05-01 MED ORDER — PRENATAL 27-0.8 MG PO TABS: 1.0000 | ORAL_TABLET | Freq: Every day | ORAL | 0 refills | Status: DC

## 2021-05-01 NOTE — Progress Notes (Signed)
Pt in Nurse clinic requesting to change her bcm from ocp to depo. States she forgets to take her ocp. Took last ocp yesterday. Consult with Beatris Si, PA who made in person visit with pt to discuss depo and options. Beatris Si, PA ordered UPT today and result is positive.  RN completed preg test visit. Pt plans prenatal care at [facility]. Pt reports LMP 01/2021 while having nexplanon. Nexplanon removed 04/01/2021 and pt has not had period since. EGA estimated using nexplanon removal date 04/01/2021. EGA [redacted] weeks 2 days. Pt plans to return to [facility] at  another date for preadmit. Jerel Shepherd, RN

## 2021-05-02 NOTE — Progress Notes (Signed)
S:  Patient into clinic requesting to change her BCM from OC to Depo.  Patient states that she has had trouble remembering to take her OC at the same time daily and has missed her OC on 2-3 occasions and did not double up on the pill the next day.  Reports that she has not been using condoms as back up and desires to change to Depo.  States that she has not used Depo as a method in the past.  Patient states that she last had a period in March, when she still had her Nexplanon in place.  Last sex was without condoms and less than a week ago. O:  WDWN female in NAD, A&O x 3, normal work of breathing, Pregnancy test= positive. A/P:  1.  Patient desires to change to Depo today. 2.  Counseled patient re: risks, benefits, and SE of Depo vs Ocs and when to call clinic for concerns. 3.  Counseled patient re: risk of pregnancy due to not taking OC correctly and not using condoms as a back up and that the pregnancy test is limited in its accuracy due to recent unprotected sex. 4.  Reviewed with patient that she could:  a) abstain and RTC for a pregnancy test and if negative, start Depo, b) if her pregnancy test today is negative, get Depo today and use condoms as a back up for 2 weeks and check a pregnancy test at home at that time.  If the pregnancy test in 2 weeks is positive, RTC for confirmatory PT and counseling on [facility] or if negative just RTC for next Depo. 5.  Patient opted for option B and was sent to the lab for a pregnancy test. 6.  Pregnancy test was positive today and RN to complete a pregnancy test flow sheet and give Resource sheet.

## 2021-06-11 ENCOUNTER — Emergency Department
Admission: EM | Admit: 2021-06-11 | Discharge: 2021-06-11 | Disposition: A | Discharge status: Home / Self Care | Payer: Self-pay | Attending: Emergency Medicine | Admitting: Emergency Medicine

## 2021-06-11 ENCOUNTER — Encounter: Payer: Self-pay | Admitting: Emergency Medicine

## 2021-06-11 ENCOUNTER — Emergency Department: Payer: Self-pay

## 2021-06-11 DIAGNOSIS — N9489 Other specified conditions associated with female genital organs and menstrual cycle: Secondary | ICD-10-CM | POA: Insufficient documentation

## 2021-06-11 DIAGNOSIS — O209 Hemorrhage in early pregnancy, unspecified: Secondary | ICD-10-CM | POA: Insufficient documentation

## 2021-06-11 DIAGNOSIS — Z3A12 12 weeks gestation of pregnancy: Secondary | ICD-10-CM | POA: Insufficient documentation

## 2021-06-11 DIAGNOSIS — N939 Abnormal uterine and vaginal bleeding, unspecified: Secondary | ICD-10-CM

## 2021-06-11 LAB — CBC
HCT: 41.3 % (ref 36.0–46.0)
Hemoglobin: 14.2 g/dL (ref 12.0–15.0)
MCH: 30.1 pg (ref 26.0–34.0)
MCHC: 34.4 g/dL (ref 30.0–36.0)
MCV: 87.7 fL (ref 80.0–100.0)
Platelets: 393 10*3/uL (ref 150–400)
RBC: 4.71 MIL/uL (ref 3.87–5.11)
RDW: 12.4 % (ref 11.5–15.5)
WBC: 11.4 10*3/uL — ABNORMAL HIGH (ref 4.0–10.5)
nRBC: 0 % (ref 0.0–0.2)

## 2021-06-11 LAB — HCG, QUANTITATIVE, PREGNANCY: hCG, Beta Chain, Quant, S: 40079 m[IU]/mL — ABNORMAL HIGH (ref <5)

## 2021-06-11 LAB — ABO/RH: ABO/RH(D): O POS

## 2021-06-11 NOTE — Discharge Instructions (Addendum)
Follow-up with the health department for repeat blood work.  Return to the emergency department for symptoms of change or worsen if you are unable to schedule appointment.

## 2021-06-11 NOTE — ED Notes (Signed)
US at bedside

## 2021-06-11 NOTE — ED Provider Notes (Signed)
[facility] Emergency Department Provider Note ____________________________________________  Time seen: Approximately 9:38 PM  I have reviewed the triage vital signs and the nursing notes.   HISTORY  Chief Complaint No chief complaint on file.    HPI [name] is a 25 y.o. female who presents to the emergency department for evaluation and treatment of vaginal bleeding over the past 24 hours.  Her last menstrual cycle was in March she estimates that she is [redacted] weeks pregnant.  Bleeding has been bright red but she denies passing any clots.  Gravida 4 para 1.  She denies abdominal pain, cramping, nausea, vomiting, or diarrhea.  Past Medical History:  Diagnosis Date   Anemia    during 2nd pregnancy    Patient Active Problem List   Diagnosis Date Noted   Overweight BMI=27.3 12/21/2020   Gall stones 03/22/2014    Past Surgical History:  Procedure Laterality Date   CHOLECYSTECTOMY  05/2014    Prior to Admission medications   Medication Sig Start Date End Date Taking? Authorizing Provider  ferrous sulfate 325 (65 FE) MG EC tablet Take 325 mg by mouth 2 (two) times daily with a meal. Patient not taking: No sig reported    [provider]  Prenatal Vit-Fe Fumarate-FA (MULTIVITAMIN-PRENATAL) 27-0.8 MG TABS tablet Take 1 tablet by mouth daily at 12 noon. Patient not taking: No sig reported    [provider]  Prenatal Vit-Fe Fumarate-FA (MULTIVITAMIN-PRENATAL) 27-0.8 MG TABS tablet Take 1 tablet by mouth daily at 12 noon. 05/01/21 08/09/21  Federico Flake, MD    Allergies Patient has no known allergies.  History reviewed. No pertinent family history.  Social History Social History   Tobacco Use   Smoking status: Never   Smokeless tobacco: Never  Vaping Use   Vaping Use: Never used  Substance Use Topics   Alcohol use: No   Drug use: No    Review of Systems Constitutional: Negative for  fever. Cardiovascular: Negative for chest pain. Respiratory: Negative for shortness of breath. Musculoskeletal: Negative for myalgias. Genitourinary: Positive for vaginal bleeding negative for open wounds or lesions. Skin: Negative for rash or lesion.  Neurological: Negative for decrease in sensation  ____________________________________________   PHYSICAL EXAM:  VITAL SIGNS: ED Triage Vitals [06/11/21 1925]  Enc Vitals Group     BP 114/74     Pulse Rate 83     Resp 18     Temp 98.5 F (36.9 C)     Temp Source Oral     SpO2 100 %     Weight      Height      Head Circumference      Peak Flow      Pain Score      Pain Loc      Pain Edu?      Excl. in GC?     Constitutional: Alert and oriented. Well appearing and in no acute distress. Eyes: Conjunctivae are clear without discharge or drainage Head: Atraumatic Neck: Supple. Respiratory: No cough. Respirations are even and unlabored. Musculoskeletal: FROM  Neurologic: Awake, alert, oriented x4. Skin: No open wounds or lesions noted on exposed skin. Psychiatric: Affect and behavior are appropriate.  ____________________________________________   LABS (all labs ordered are listed, but only abnormal results are displayed)  Labs Reviewed  CBC - Abnormal; Notable for the following components:      Result Value   WBC 11.4 (*)    All other components within normal limits  HCG, QUANTITATIVE, PREGNANCY - Abnormal; Notable for the following components:   hCG, Beta Chain, Quant, S 40,079 (*)    All other components within normal limits  POC URINE PREG, ED  ABO/RH   ____________________________________________  RADIOLOGY  Ultrasound shows irregular intrauterine fluid collection potentially a gestational sac but without embryo or yolk sac.   I, Kem Boroughs, personally viewed and evaluated these images (plain radiographs) as part of my medical decision making, as well as reviewing the written report by the  radiologist.  US OB LESS THAN 14 WEEKS WITH OB TRANSVAGINAL  Result Date: 06/11/2021 CLINICAL DATA:  Vaginal bleeding EXAM: OBSTETRIC <14 WK [place] AND TRANSVAGINAL OB US TECHNIQUE: Both transabdominal and transvaginal ultrasound examinations were performed for complete evaluation of the gestation as well as the maternal uterus, adnexal regions, and pelvic cul-de-sac. Transvaginal technique was performed to assess early pregnancy. COMPARISON:  None. FINDINGS: Intrauterine gestational sac: Intrauterine irregular fluid collection, possible gestational sac. Yolk sac:  Not Visualized. Embryo:  Not Visualized. Cardiac Activity: Not Visualized. MSD: 14.7 mm   6 w   2 d Subchorionic hemorrhage: Heterogenous hypoechoic collection adjacent to intrauterine fluid collection probably represents at least moderate subchorionic hemorrhage. Maternal uterus/adnexae: Ovaries are within normal limits. Right ovary measures 2.7 x 1.7 by 1.5 cm. The left ovary measures 1.5 x 2.5 by 1.5 cm. No significant free fluid IMPRESSION: 1. Irregular intrauterine fluid collection, questionable for gestational sac but no embryo or yolk sac identified within. Heterogenous hypoechoic collection adjacent to intrauterine fluid collection, suspicious for at least moderate subchorionic hemorrhage assuming intrauterine fluid collection represents gestational sac. No obvious adnexal mass lesion. Recommend trending of HCG with repeat ultrasound as indicated Electronically Signed   By: Jasmine Pang M.D.   On: 06/11/2021 21:19   ____________________________________________   PROCEDURES  Procedures  ____________________________________________   INITIAL IMPRESSION / ASSESSMENT AND PLAN / ED COURSE  [name] is a 25 y.o. who presents to the emergency department for evaluation of vaginal bleeding with the last menstrual cycle in May  See HPI for further details.  Ultrasound shows no confirmed IUP but there is an irregular  intrauterine fluid collection questionable for gestational sac without embryo or yolk sac within.  There is also a heterogenous hypoechoic collection adjacent to the intrauterine fluid collection that is suspicious for subchorionic hemorrhage.  No adnexal mass or lesion.  Results were discussed with the patient and she was advised that she will need to have repeat labs in 2 to 3 days to see if the hCG is trending upward.  With her last menstrual cycle being in May this is most likely a miscarriage.  She was given strict emergency department return precautions.  She is to call the health department to request a follow-up appointment for repeat blood work.  Medications - No data to display  Pertinent labs & imaging results that were available during my care of the patient were reviewed by me and considered in my medical decision making (see chart for details).   _________________________________________   FINAL CLINICAL IMPRESSION(S) / ED DIAGNOSES  Final diagnoses:  Vagina bleeding  Vaginal bleeding in pregnancy, first trimester    ED Discharge Orders     None        If controlled substance prescribed during this visit, 12 month history viewed on the [facility] prior to issuing an initial prescription for Schedule II or III opiod.    Chinita Pester, FNP 06/11/21 2156    Phineas Semen, MD  06/11/21 2305  

## 2021-06-11 NOTE — ED Triage Notes (Signed)
Pt c/o vaginal bleeding x1 day at approx [redacted] weeks pregnant. Pt describes bleeding as bright red when wiping with no clots. G4P1

## 2021-06-14 ENCOUNTER — Other Ambulatory Visit: Payer: Self-pay

## 2021-06-14 ENCOUNTER — Ambulatory Visit: Payer: Self-pay | Admitting: Advanced Practice Midwife

## 2021-06-14 ENCOUNTER — Encounter: Payer: Self-pay | Admitting: Advanced Practice Midwife

## 2021-06-14 VITALS — BP 99/65 | HR 77 | Ht 60.0 in | Wt 142.2 lb

## 2021-06-14 DIAGNOSIS — O039 Complete or unspecified spontaneous abortion without complication: Secondary | ICD-10-CM

## 2021-06-14 DIAGNOSIS — O09299 Supervision of pregnancy with other poor reproductive or obstetric history, unspecified trimester: Secondary | ICD-10-CM | POA: Insufficient documentation

## 2021-06-14 NOTE — Progress Notes (Signed)
25 yo HF G4P3 (7,5,3) with probable incomplete SAB. Nexplanon inserted 01/29/21 and removed 04/01/21 because pt c/o weight gain and wanted ocp's and no pregnancy. Was noncompliant with ocp's. LMP 01/29/21. Last sex 06/01/21 without condom. Went to [facility] 06/11/21 with bleeding. Pt states had Bhcg=40,079 and u/s with no confirmed IUP but an irregular intrauterine fluid collection questionable for gestational sac without embryo or yolk sac within.  S: "I am here to get my blood drawn". I went to [facility] 06/11/21 and [facility] 06/12/21. O: small amt bleeding A:  probable missed AB P:  sent by [facility] for serial Bhcg and u/s f/u. Unable to see any labs at [facility] from 06/12/21. Bhcg today

## 2021-06-15 LAB — BETA HCG QUANT (REF LAB): hCG Quant: 16675 m[IU]/mL

## 2021-06-18 ENCOUNTER — Telehealth: Payer: Self-pay

## 2021-06-18 NOTE — Telephone Encounter (Signed)
Call to client to schedule appt for repeat beta hcg week of 06/24/2021 as per [facility] order (attached to 06/14/2021 beta hcg result). Left message to call with number provided. [employer] ID # [id number] assisted with call. Jossie Ng, RN

## 2021-06-19 NOTE — Telephone Encounter (Signed)
Call to client with [facility] as needs repeat beta hcg next week. Left message to call regarding need for bloodwork and number to call provided. Jossie Ng, RN

## 2021-06-19 NOTE — Telephone Encounter (Signed)
Call to client to schedule repeat beta hcg for next week per [facility] CNM order (see result note attached to 06/14/2021 lab). Client reports small amount blood on tissue after voiding. Denies any pain. Appt scheduled for 06/24/2021. Client states on 06/12/21 (day after [facility] ED evaluation) she went to [facility] and they told her they saw a baby. [facility] US impression read to client (findings highly suggestive of a failed pregnancy). Jossie Ng, RN

## 2021-06-24 ENCOUNTER — Other Ambulatory Visit: Payer: Self-pay

## 2021-06-25 ENCOUNTER — Other Ambulatory Visit: Payer: Self-pay

## 2021-06-25 DIAGNOSIS — O031 Delayed or excessive hemorrhage following incomplete spontaneous abortion: Secondary | ICD-10-CM | POA: Insufficient documentation

## 2021-06-25 DIAGNOSIS — Z20822 Contact with and (suspected) exposure to covid-19: Secondary | ICD-10-CM | POA: Insufficient documentation

## 2021-06-25 DIAGNOSIS — Z79899 Other long term (current) drug therapy: Secondary | ICD-10-CM | POA: Insufficient documentation

## 2021-06-25 DIAGNOSIS — Z9049 Acquired absence of other specified parts of digestive tract: Secondary | ICD-10-CM | POA: Insufficient documentation

## 2021-06-25 LAB — CBC WITH DIFFERENTIAL/PLATELET
Abs Immature Granulocytes: 0.04 10*3/uL (ref 0.00–0.07)
Basophils Absolute: 0 10*3/uL (ref 0.0–0.1)
Basophils Relative: 0 %
Eosinophils Absolute: 0.6 10*3/uL — ABNORMAL HIGH (ref 0.0–0.5)
Eosinophils Relative: 5 %
HCT: 37.4 % (ref 36.0–46.0)
Hemoglobin: 12.9 g/dL (ref 12.0–15.0)
Immature Granulocytes: 0 %
Lymphocytes Relative: 20 %
Lymphs Abs: 2.4 10*3/uL (ref 0.7–4.0)
MCH: 30.4 pg (ref 26.0–34.0)
MCHC: 34.5 g/dL (ref 30.0–36.0)
MCV: 88 fL (ref 80.0–100.0)
Monocytes Absolute: 0.5 10*3/uL (ref 0.1–1.0)
Monocytes Relative: 4 %
Neutro Abs: 8.5 10*3/uL — ABNORMAL HIGH (ref 1.7–7.7)
Neutrophils Relative %: 71 %
Platelets: 385 10*3/uL (ref 150–400)
RBC: 4.25 MIL/uL (ref 3.87–5.11)
RDW: 12.7 % (ref 11.5–15.5)
WBC: 12.2 10*3/uL — ABNORMAL HIGH (ref 4.0–10.5)
nRBC: 0 % (ref 0.0–0.2)

## 2021-06-25 LAB — BASIC METABOLIC PANEL
Anion gap: 5 (ref 5–15)
BUN: 9 mg/dL (ref 6–20)
CO2: 24 mmol/L (ref 22–32)
Calcium: 9.1 mg/dL (ref 8.9–10.3)
Chloride: 108 mmol/L (ref 98–111)
Creatinine, Ser: 0.64 mg/dL (ref 0.44–1.00)
GFR, Estimated: >60 mL/min (ref >60)
Glucose, Bld: 111 mg/dL — ABNORMAL HIGH (ref 70–99)
Potassium: 3.7 mmol/L (ref 3.5–5.1)
Sodium: 137 mmol/L (ref 135–145)

## 2021-06-25 LAB — POC URINE PREG, ED: Preg Test, Ur: POSITIVE — AB

## 2021-06-25 NOTE — ED Triage Notes (Signed)
Pt states she is having vaginal bleeding, pt states that she had an inc in bleeding tonight and has soaked through 3 pads in an hour, pt states that she was pregnant and came in to the ED on 7/19 and was told she was having a miscarriage. Pt also states she is having abd cramping.

## 2021-06-26 ENCOUNTER — Encounter
Admission: EM | Disposition: A | Discharge status: Home / Self Care | Payer: Self-pay | Source: Home / Self Care | Attending: Emergency Medicine

## 2021-06-26 ENCOUNTER — Ambulatory Visit
Admission: EM | Admit: 2021-06-26 | Discharge: 2021-06-26 | Disposition: A | Discharge status: Home / Self Care | Payer: Self-pay | Attending: Emergency Medicine | Admitting: Emergency Medicine

## 2021-06-26 ENCOUNTER — Encounter: Payer: Self-pay | Admitting: Emergency Medicine

## 2021-06-26 ENCOUNTER — Emergency Department: Payer: Self-pay | Admitting: Registered Nurse

## 2021-06-26 ENCOUNTER — Emergency Department: Payer: Self-pay

## 2021-06-26 ENCOUNTER — Other Ambulatory Visit: Payer: Self-pay

## 2021-06-26 DIAGNOSIS — O034 Incomplete spontaneous abortion without complication: Secondary | ICD-10-CM

## 2021-06-26 DIAGNOSIS — O4690 Antepartum hemorrhage, unspecified, unspecified trimester: Secondary | ICD-10-CM

## 2021-06-26 HISTORY — PX: DILATION AND EVACUATION: SHX1459

## 2021-06-26 LAB — URINALYSIS, COMPLETE (UACMP) WITH MICROSCOPIC
Bacteria, UA: NONE SEEN
RBC / HPF: >50 RBC/hpf — ABNORMAL HIGH (ref 0–5)
Specific Gravity, Urine: 1.023 (ref 1.005–1.030)
Squamous Epithelial / HPF: NONE SEEN (ref 0–5)

## 2021-06-26 LAB — RESP PANEL BY RT-PCR (FLU A&B, COVID) ARPGX2
Influenza A by PCR: NEGATIVE
Influenza B by PCR: NEGATIVE
SARS Coronavirus 2 by RT PCR: NEGATIVE

## 2021-06-26 LAB — HCG, QUANTITATIVE, PREGNANCY: hCG, Beta Chain, Quant, S: 5074 m[IU]/mL — ABNORMAL HIGH (ref <5)

## 2021-06-26 LAB — ABO/RH: ABO/RH(D): O POS

## 2021-06-26 LAB — TYPE AND SCREEN
ABO/RH(D): O POS
Antibody Screen: NEGATIVE

## 2021-06-26 SURGERY — DILATION AND EVACUATION, UTERUS
Anesthesia: General

## 2021-06-26 MED ORDER — DOXYCYCLINE HYCLATE 100 MG PO TABS
200.0000 mg | ORAL_TABLET | Freq: Once | ORAL | Status: AC
  Administered 2021-06-26: 200 mg via ORAL
  Filled 2021-06-26: qty 2

## 2021-06-26 MED ORDER — POVIDONE-IODINE 10 % EX SWAB
2.0000 "application " | Freq: Once | CUTANEOUS | Status: DC
  Filled 2021-06-26: qty 2

## 2021-06-26 MED ORDER — PROPOFOL 10 MG/ML IV BOLUS
INTRAVENOUS | Status: AC
  Filled 2021-06-26: qty 20

## 2021-06-26 MED ORDER — MIDAZOLAM HCL 2 MG/2ML IJ SOLN
INTRAMUSCULAR | Status: DC | PRN
  Administered 2021-06-26: 2 mg via INTRAVENOUS

## 2021-06-26 MED ORDER — FENTANYL CITRATE (PF) 100 MCG/2ML IJ SOLN
INTRAMUSCULAR | Status: AC
  Filled 2021-06-26: qty 2

## 2021-06-26 MED ORDER — FENTANYL CITRATE (PF) 100 MCG/2ML IJ SOLN
INTRAMUSCULAR | Status: AC
  Administered 2021-06-26: 25 ug via INTRAVENOUS
  Filled 2021-06-26: qty 2

## 2021-06-26 MED ORDER — SILVER NITRATE-POT NITRATE 75-25 % EX MISC
CUTANEOUS | Status: AC
  Filled 2021-06-26: qty 10

## 2021-06-26 MED ORDER — TRANEXAMIC ACID 1000 MG/10ML IV SOLN
INTRAVENOUS | Status: AC
  Filled 2021-06-26: qty 10

## 2021-06-26 MED ORDER — METHYLERGONOVINE MALEATE 0.2 MG/ML IJ SOLN
INTRAMUSCULAR | Status: AC
  Filled 2021-06-26: qty 1

## 2021-06-26 MED ORDER — TRANEXAMIC ACID-NACL 1000-0.7 MG/100ML-% IV SOLN
INTRAVENOUS | Status: DC | PRN
  Administered 2021-06-26: 1000 mg via INTRAVENOUS

## 2021-06-26 MED ORDER — ACETAMINOPHEN 10 MG/ML IV SOLN
INTRAVENOUS | Status: AC
  Filled 2021-06-26: qty 100

## 2021-06-26 MED ORDER — FENTANYL CITRATE (PF) 100 MCG/2ML IJ SOLN
INTRAMUSCULAR | Status: DC | PRN
  Administered 2021-06-26: 25 ug via INTRAVENOUS

## 2021-06-26 MED ORDER — ONDANSETRON HCL 4 MG/2ML IJ SOLN
INTRAMUSCULAR | Status: AC
  Administered 2021-06-26: 4 mg via INTRAVENOUS
  Filled 2021-06-26: qty 2

## 2021-06-26 MED ORDER — ACETAMINOPHEN 10 MG/ML IV SOLN
INTRAVENOUS | Status: DC | PRN
  Administered 2021-06-26: 1000 mg via INTRAVENOUS

## 2021-06-26 MED ORDER — PHENYLEPHRINE HCL (PRESSORS) 10 MG/ML IV SOLN
INTRAVENOUS | Status: AC
  Filled 2021-06-26: qty 1

## 2021-06-26 MED ORDER — ONDANSETRON HCL 4 MG/2ML IJ SOLN: 4.0000 mg | Freq: Once | INTRAMUSCULAR | Status: AC | PRN

## 2021-06-26 MED ORDER — DOXYCYCLINE HYCLATE 100 MG PO TABS: 100.0000 mg | ORAL_TABLET | Freq: Once | ORAL | Status: DC

## 2021-06-26 MED ORDER — EPHEDRINE SULFATE 50 MG/ML IJ SOLN
INTRAMUSCULAR | Status: DC | PRN
  Administered 2021-06-26: 15 mg via INTRAVENOUS

## 2021-06-26 MED ORDER — OXYCODONE HCL 5 MG PO TABS
5.0000 mg | ORAL_TABLET | Freq: Once | ORAL | Status: AC
  Administered 2021-06-26: 5 mg via ORAL

## 2021-06-26 MED ORDER — LIDOCAINE HCL (CARDIAC) PF 100 MG/5ML IV SOSY
PREFILLED_SYRINGE | INTRAVENOUS | Status: DC | PRN
  Administered 2021-06-26: 100 mg via INTRAVENOUS

## 2021-06-26 MED ORDER — ONDANSETRON HCL 4 MG/2ML IJ SOLN
INTRAMUSCULAR | Status: DC | PRN
  Administered 2021-06-26: 4 mg via INTRAVENOUS

## 2021-06-26 MED ORDER — PHENYLEPHRINE HCL (PRESSORS) 10 MG/ML IV SOLN
INTRAVENOUS | Status: DC | PRN
  Administered 2021-06-26: 200 ug via INTRAVENOUS

## 2021-06-26 MED ORDER — OXYCODONE HCL 5 MG PO TABS
ORAL_TABLET | ORAL | Status: AC
  Filled 2021-06-26: qty 1

## 2021-06-26 MED ORDER — LACTATED RINGERS IV BOLUS
1000.0000 mL | Freq: Once | INTRAVENOUS | Status: AC
  Administered 2021-06-26: 1000 mL via INTRAVENOUS

## 2021-06-26 MED ORDER — METHYLERGONOVINE MALEATE 0.2 MG/ML IJ SOLN
INTRAMUSCULAR | Status: DC | PRN
  Administered 2021-06-26: .2 mg via INTRAMUSCULAR

## 2021-06-26 MED ORDER — MIDAZOLAM HCL 2 MG/2ML IJ SOLN
INTRAMUSCULAR | Status: AC
  Filled 2021-06-26: qty 2

## 2021-06-26 MED ORDER — FENTANYL CITRATE (PF) 100 MCG/2ML IJ SOLN
25.0000 ug | INTRAMUSCULAR | Status: DC | PRN
  Administered 2021-06-26 (×3): 25 ug via INTRAVENOUS

## 2021-06-26 MED ORDER — PROPOFOL 10 MG/ML IV BOLUS
INTRAVENOUS | Status: DC | PRN
  Administered 2021-06-26: 30 mg via INTRAVENOUS
  Administered 2021-06-26: 150 mg via INTRAVENOUS

## 2021-06-26 MED ORDER — DEXAMETHASONE SODIUM PHOSPHATE 10 MG/ML IJ SOLN
INTRAMUSCULAR | Status: DC | PRN
  Administered 2021-06-26: 10 mg via INTRAVENOUS

## 2021-06-26 MED ORDER — LACTATED RINGERS IV SOLN: INTRAVENOUS | Status: DC

## 2021-06-26 SURGICAL SUPPLY — 27 items
BACTOSHIELD CHG 4% 4OZ (MISCELLANEOUS) ×1
CATH ROBINSON RED A/P 16FR (CATHETERS) ×2 IMPLANT
FILTER UTR ASPR SPEC (MISCELLANEOUS) ×1 IMPLANT
FLTR UTR ASPR SPEC (MISCELLANEOUS) ×2
GAUZE 4X4 16PLY ~~LOC~~+RFID DBL (SPONGE) ×2 IMPLANT
GLOVE SURG ENC MOIS LTX SZ7 (GLOVE) ×2 IMPLANT
GLOVE SURG UNDER LTX SZ7.5 (GLOVE) ×2 IMPLANT
GOWN STRL REUS W/ TWL LRG LVL3 (GOWN DISPOSABLE) ×2 IMPLANT
GOWN STRL REUS W/TWL LRG LVL3 (GOWN DISPOSABLE) ×4
KIT BERKELEY 1ST TRIMESTER 3/8 (MISCELLANEOUS) ×2 IMPLANT
KIT TURNOVER CYSTO (KITS) ×2 IMPLANT
MANIFOLD NEPTUNE II (INSTRUMENTS) IMPLANT
NS IRRIG 500ML POUR BTL (IV SOLUTION) ×2 IMPLANT
PACK DNC HYST (MISCELLANEOUS) ×2 IMPLANT
PAD OB MATERNITY 4.3X12.25 (PERSONAL CARE ITEMS) ×2 IMPLANT
PAD PREP 24X41 OB/GYN DISP (PERSONAL CARE ITEMS) ×2 IMPLANT
SCRUB CHG 4% DYNA-HEX 4OZ (MISCELLANEOUS) ×1 IMPLANT
SET BERKELEY SUCTION TUBING (SUCTIONS) ×2 IMPLANT
SOL PREP PVP 2OZ (MISCELLANEOUS) ×2
SOLUTION PREP PVP 2OZ (MISCELLANEOUS) ×1 IMPLANT
TOWEL OR 17X26 4PK STRL BLUE (TOWEL DISPOSABLE) ×2 IMPLANT
VACURETTE 10 RIGID CVD (CANNULA) IMPLANT
VACURETTE 6 ASPIR F TIP BERK (CANNULA) ×2 IMPLANT
VACURETTE 7MM F TIP (CANNULA)
VACURETTE 7MM F TIP STRL (CANNULA) IMPLANT
VACURETTE 8 RIGID CVD (CANNULA) ×2 IMPLANT
VACURETTE 8MM F TIP (MISCELLANEOUS) ×2 IMPLANT

## 2021-06-26 NOTE — Op Note (Signed)
Operative Report Suction Dilation and Curettage   Indications: Postpartum bleeding  Pre-operative Diagnosis:  Suspected retained products of conception   Post-operative Diagnosis: same.  Procedure: 1. Suction D&C  Surgeon: Christeen Douglas, MD  Assistant(s):  None  Anesthesia: General LMA anesthesia  Anesthesiologist: Yevette Edwards, MD Anesthesiologist: Yevette Edwards, MD CRNA: Irving Burton, CRNA; Stormy Fabian, CRNA  Estimated Blood Loss:   67ml         Intraoperative medications: 0.2mg  IM methergine; 1g TXA         Total IV Fluids:  Urine Output: 41ml         Specimens: POC         Complications:  None; patient tolerated the procedure well.         Disposition: PACU - hemodynamically stable.         Condition: stable  Findings: Uterus measuring 7 weeks; normal cervix, vagina, perineum. Heavy bleeding in the middle of the case.  Indication for procedure/Consents: 25 y.o. F [id number] at unknown GA with LMP in March here for emergent surgery for treatment of post-SAB bleeding with suspected retained products of conception.    Risks of surgery were discussed with the patient including but not limited to: bleeding which may require transfusion; infection which may require antibiotics; injury to uterus or surrounding organs; intrauterine scarring which may impair future fertility; need for additional procedures including laparotomy or laparoscopy; and other postoperative/anesthesia complications. Written informed consent was obtained.    Procedure Details:   The patient did not receive oral antibiotics while in the preoperative area.  She was then taken to the operating room where general anesthesia was administered and was found to be adequate.  After a formal and adequate timeout was performed, she was placed in the dorsal lithotomy position and examined with the above findings. She was then prepped and draped in the sterile manner.   Her bladder was catheterized  for an estimated amount of clear, yellow urine. A speculum was then placed in the patient's vagina and a single tooth tenaculum was applied to the anterior lip of the cervix.    No uterine sounding was performed on this pregnant uterus. Her cervix was serially dilated to accommodate a 7 sized flexible suction curette. A size 8 rigid was used when the heavy bleeding started, and methergine and TXA ordered A gentle sharp curettage was then performed until there was a gritty texture in all four quadrants.  The tenaculum was removed from the anterior lip of the cervix and the vaginal speculum was removed after noting good hemostasis. The patient tolerated the procedure well and was taken to the recovery area awake, extubated and in stable condition.  The patient will be discharged to home as per PACU criteria.  She will receive her dose of oral antibiotics prior to discharge. Routine postoperative instructions given.  She was prescribed Percocet, Ibuprofen and Colace.  She will follow up in the clinic in two weeks for postoperative evaluation.

## 2021-06-26 NOTE — Anesthesia Preprocedure Evaluation (Signed)
Anesthesia Evaluation  Patient identified by MRN, date of birth, ID band Patient awake    Reviewed: Allergy & Precautions, H&P , NPO status , Patient's Chart, lab work & pertinent test results, reviewed documented beta blocker date and time   Airway Mallampati: II  TM Distance: >3 FB Neck ROM: full    Dental  (+) Teeth Intact   Pulmonary neg pulmonary ROS,    Pulmonary exam normal        Cardiovascular Exercise Tolerance: Good negative cardio ROS Normal cardiovascular exam Rate:Normal     Neuro/Psych negative neurological ROS  negative psych ROS   GI/Hepatic negative GI ROS, Neg liver ROS,   Endo/Other  negative endocrine ROS  Renal/GU negative Renal ROS  negative genitourinary   Musculoskeletal   Abdominal   Peds  Hematology  (+) Blood dyscrasia, anemia ,   Anesthesia Other Findings   Reproductive/Obstetrics negative OB ROS                             Anesthesia Physical Anesthesia Plan  ASA: 2  Anesthesia Plan: General LMA   Post-op Pain Management:    Induction:   PONV Risk Score and Plan:   Airway Management Planned:   Additional Equipment:   Intra-op Plan:   Post-operative Plan:   Informed Consent: I have reviewed the patients History and Physical, chart, labs and discussed the procedure including the risks, benefits and alternatives for the proposed anesthesia with the patient or authorized representative who has indicated his/her understanding and acceptance.       Plan Discussed with: CRNA  Anesthesia Plan Comments:         Anesthesia Quick Evaluation  

## 2021-06-26 NOTE — Anesthesia Procedure Notes (Signed)
Procedure Name: LMA Insertion Date/Time: 06/26/2021 9:53 AM Performed by: Stormy Fabian, CRNA Pre-anesthesia Checklist: Patient identified, Patient being monitored, Timeout performed, Emergency Drugs available and Suction available Patient Re-evaluated:Patient Re-evaluated prior to induction Oxygen Delivery Method: Circle system utilized Preoxygenation: Pre-oxygenation with 100% oxygen Induction Type: IV induction Ventilation: Mask ventilation without difficulty LMA: LMA inserted LMA Size: 3.0 Tube type: Oral Number of attempts: 1 Placement Confirmation: positive ETCO2 and breath sounds checked- equal and bilateral Tube secured with: Tape Dental Injury: Teeth and Oropharynx as per pre-operative assessment

## 2021-06-26 NOTE — Anesthesia Postprocedure Evaluation (Signed)
Anesthesia Post Note  Patient: [name], [name]  Procedure(s) Performed: DILATATION AND EVACUATION  Patient location during evaluation: PACU Anesthesia Type: General Level of consciousness: awake and alert Pain management: pain level controlled Vital Signs Assessment: post-procedure vital signs reviewed and stable Respiratory status: spontaneous breathing, nonlabored ventilation, respiratory function stable and patient connected to nasal cannula oxygen Cardiovascular status: blood pressure returned to baseline and stable Postop Assessment: no apparent nausea or vomiting Anesthetic complications: no   No notable events documented.   Last Vitals:  Vitals:   06/26/21 0800 06/26/21 0851  BP: (!) 104/58 (!) 91/51  Pulse: 73 66  Resp: 17 16  Temp:  36.9 C  SpO2: 100% 100%    Last Pain:  Vitals:   06/26/21 0851  TempSrc: Temporal  PainSc: 0-No pain                 Yevette Edwards

## 2021-06-26 NOTE — Transfer of Care (Signed)
Immediate Anesthesia Transfer of Care Note  Patient: [name]  Procedure(s) Performed: DILATATION AND EVACUATION  Patient Location: PACU  Anesthesia Type:General  Level of Consciousness: awake  Airway & Oxygen Therapy: Patient Spontanous Breathing and Patient connected to face mask oxygen  Post-op Assessment: Report given to RN and Post -op Vital signs reviewed and stable  Post vital signs: Reviewed and stable  Last Vitals:  Vitals Value Taken Time  BP 128/77   Temp    Pulse 107 06/26/21 1027  Resp 37 06/26/21 1027  SpO2 100 % 06/26/21 1027  Vitals shown include unvalidated device data.  Last Pain:  Vitals:   06/26/21 0851  TempSrc: Temporal  PainSc: 0-No pain         Complications: No notable events documented.

## 2021-06-26 NOTE — ED Provider Notes (Signed)
[facility] Emergency Department Provider Note  ____________________________________________  Time seen: Approximately 5:36 AM  I have reviewed the triage vital signs and the nursing notes.   HISTORY  Chief Complaint Vaginal Bleeding   HPI [name] is a 25 y.o. female presents for evaluation of vaginal bleeding.  Patient was seen here 2 weeks ago for vaginal bleeding.  At that time she was estimated to be at [redacted] weeks gestational age per LMP.  She had an ultrasound showing failed pregnancy.  Since then she has been having intermittent vaginal bleeding.  She reports that today the vaginal bleeding started to become heavy and she has passed several blood clots.  She was complaining of more severe cramping and some dizziness associated with it which prompted her visit to the emergency room.  Patient reports this is her third pregnancy.  She had 2 normal full-term pregnancies before.  She denies any history of bleeding disorders or blood thinners.  Past Medical History:  Diagnosis Date   Anemia    during 2nd pregnancy    Patient Active Problem List   Diagnosis Date Noted   Miscarriage 06/14/2021   Overweight BMI=27.3 12/21/2020   Gall stones 03/22/2014    Past Surgical History:  Procedure Laterality Date   CHOLECYSTECTOMY  05/2014    Prior to Admission medications   Medication Sig Start Date End Date Taking? Authorizing Provider  ferrous sulfate 325 (65 FE) MG EC tablet Take 325 mg by mouth 2 (two) times daily with a meal. Patient not taking: No sig reported    [provider]  Prenatal Vit-Fe Fumarate-FA (MULTIVITAMIN-PRENATAL) 27-0.8 MG TABS tablet Take 1 tablet by mouth daily at 12 noon. Patient not taking: No sig reported    [provider]  Prenatal Vit-Fe Fumarate-FA (MULTIVITAMIN-PRENATAL) 27-0.8 MG TABS tablet Take 1 tablet by mouth daily at 12 noon. 05/01/21 08/09/21  Federico Flake, MD     Allergies Patient has no known allergies.  No family history on file.  Social History Social History   Tobacco Use   Smoking status: Never   Smokeless tobacco: Never  Vaping Use   Vaping Use: Never used  Substance Use Topics   Alcohol use: No   Drug use: No    Review of Systems  Constitutional: Negative for fever. Eyes: Negative for visual changes. ENT: Negative for sore throat. Neck: No neck pain  Cardiovascular: Negative for chest pain. Respiratory: Negative for shortness of breath. Gastrointestinal: + lower abdominal cramping. Negative for abdominal pain, vomiting or diarrhea. Genitourinary: Negative for dysuria. + vaginal bleeding Musculoskeletal: Negative for back pain. Skin: Negative for rash. Neurological: Negative for headaches, weakness or numbness. Psych: No SI or HI  ____________________________________________   PHYSICAL EXAM:  VITAL SIGNS: ED Triage Vitals  Enc Vitals Group     BP 06/25/21 2248 105/73     Pulse Rate 06/25/21 2248 95     Resp 06/25/21 2248 20     Temp 06/25/21 2248 98.9 F (37.2 C)     Temp Source 06/25/21 2248 Oral     SpO2 06/25/21 2248 100 %     Weight 06/25/21 2256 142 lb 3.2 oz (64.5 kg)     Height 06/25/21 2256 5' (1.524 m)     Head Circumference --      Peak Flow --      Pain Score 06/25/21 2256 4     Pain Loc --      Pain Edu? --  Excl. in GC? --     Constitutional: Alert and oriented. Well appearing and in no apparent distress. HEENT:      Head: Normocephalic and atraumatic.         Eyes: Conjunctivae are normal. Sclera is non-icteric.       Mouth/Throat: Mucous membranes are moist.       Neck: Supple with no signs of meningismus. Cardiovascular: Regular rate and rhythm. No murmurs, gallops, or rubs. 2+ symmetrical distal pulses are present in all extremities. No JVD. Respiratory: Normal respiratory effort. Lungs are clear to auscultation bilaterally.  Gastrointestinal: Soft, non tender, and non  distended with positive bowel sounds. No rebound or guarding. Genitourinary: No CVA tenderness. Musculoskeletal:  No edema, cyanosis, or erythema of extremities. Neurologic: Normal speech and language. Face is symmetric. Moving all extremities. No gross focal neurologic deficits are appreciated. Skin: Skin is warm, dry and intact. No rash noted. Psychiatric: Mood and affect are normal. Speech and behavior are normal.  ____________________________________________   LABS (all labs ordered are listed, but only abnormal results are displayed)  Labs Reviewed  CBC WITH DIFFERENTIAL/PLATELET - Abnormal; Notable for the following components:      Result Value   WBC 12.2 (*)    Neutro Abs 8.5 (*)    Eosinophils Absolute 0.6 (*)    All other components within normal limits  BASIC METABOLIC PANEL - Abnormal; Notable for the following components:   Glucose, Bld 111 (*)    All other components within normal limits  HCG, QUANTITATIVE, PREGNANCY - Abnormal; Notable for the following components:   hCG, Beta Chain, Quant, S 5,074 (*)    All other components within normal limits  URINALYSIS, COMPLETE (UACMP) WITH MICROSCOPIC - Abnormal; Notable for the following components:   Color, Urine RED (*)    APPearance HAZY (*)    Glucose, UA   (*)    Value: TEST NOT REPORTED DUE TO COLOR INTERFERENCE OF URINE PIGMENT   Hgb urine dipstick   (*)    Value: TEST NOT REPORTED DUE TO COLOR INTERFERENCE OF URINE PIGMENT   Bilirubin Urine   (*)    Value: TEST NOT REPORTED DUE TO COLOR INTERFERENCE OF URINE PIGMENT   Ketones, ur   (*)    Value: TEST NOT REPORTED DUE TO COLOR INTERFERENCE OF URINE PIGMENT   Protein, ur   (*)    Value: TEST NOT REPORTED DUE TO COLOR INTERFERENCE OF URINE PIGMENT   Nitrite   (*)    Value: TEST NOT REPORTED DUE TO COLOR INTERFERENCE OF URINE PIGMENT   Leukocytes,Ua   (*)    Value: TEST NOT REPORTED DUE TO COLOR INTERFERENCE OF URINE PIGMENT   RBC / HPF >50 (*)    All other  components within normal limits  POC URINE PREG, ED - Abnormal; Notable for the following components:   Preg Test, Ur POSITIVE (*)    All other components within normal limits  RESP PANEL BY RT-PCR (FLU A&B, COVID) ARPGX2  POC URINE PREG, ED  ABO/RH   ____________________________________________  EKG  none  ____________________________________________  RADIOLOGY  I have personally reviewed the images performed during this visit and I agree with the Radiologist's read.   Interpretation by Radiologist:  US OB LESS THAN 14 WEEKS WITH OB TRANSVAGINAL  Result Date: 06/26/2021 CLINICAL DATA:  25 year old female with history of vaginal bleeding and decreasing beta hCG levels. Passing clots vaginally. EXAM: OBSTETRIC <14 WK [place] AND TRANSVAGINAL OB US DOPPLER ULTRASOUND OF OVARIES  TECHNIQUE: Both transabdominal and transvaginal ultrasound examinations were performed for complete evaluation of the gestation as well as the maternal uterus, adnexal regions, and pelvic cul-de-sac. Transvaginal technique was performed to assess early pregnancy. Color and duplex Doppler ultrasound was utilized to evaluate blood flow to the ovaries. COMPARISON:  Ob ultrasound 06/11/2021. FINDINGS: Intrauterine gestational sac: None Yolk sac:  None Embryo:  None Cardiac Activity: None Heart Rate: N/A Subchorionic hemorrhage:  None visualized. Maternal uterus/adnexae: In the fundus of the uterus there is a irregular-shaped hypoechoic structure with some internal septations and low level internal echoes which has increased in size compared to the prior study, currently measuring 1.8 x 1.2 x 2.3 cm, now extending from the endometrial cavity into the adjacent fundal myometrium posteriorly with surrounding hypervascularity in the adjacent uterus, concerning for retained products of conception. Bilateral ovaries are normal in appearance. Pulsed Doppler evaluation of both ovaries demonstrates normal appearing low-resistance arterial  and venous waveforms. IMPRESSION: 1. No viable IUP identified. Structure in the fundal portion of the endometrial cavity is enlarging compared to the prior study, highly concerning for probable retained products of conception. Consultation with OB-Gyn is recommended. Electronically Signed   By: Trudie Reed M.D.   On: 06/26/2021 05:24     ____________________________________________   PROCEDURES  Procedure(s) performed: None Procedures Critical Care performed:  None ____________________________________________   INITIAL IMPRESSION / ASSESSMENT AND PLAN / ED COURSE  25 y.o. female presents for evaluation of vaginal bleeding.  Patient hemodynamically stable with normal hemoglobin.  Ultrasound done today shows that the failed gestational sac seems to be enlarging when compared to prior from 2 weeks ago.  This is concerning for retained products of conception.  I did discuss with Dr. Dalbert Garnet from OB/GYN who will evaluate patient in the emergency room but has recommended a D&C at this point.  No indication for blood transfusion or RhoGAM.  Old medical records review including imaging and ER note from prior visit 2 weeks ago.      _____________________________________________ Please note:  Patient was evaluated in Emergency Department today for the symptoms described in the history of present illness. Patient was evaluated in the context of the global COVID-19 pandemic, which necessitated consideration that the patient might be at risk for infection with the SARS-CoV-2 virus that causes COVID-19. Institutional protocols and algorithms that pertain to the evaluation of patients at risk for COVID-19 are in a state of rapid change based on information released by regulatory bodies including the CDC and federal and state organizations. These policies and algorithms were followed during the patient's care in the ED.  Some ED evaluations and interventions may be delayed as a result of limited staffing  during the pandemic.   [place] Controlled Substance Database was reviewed by me. ____________________________________________   FINAL CLINICAL IMPRESSION(S) / ED DIAGNOSES   Final diagnoses:  Retained products of conception after miscarriage      NEW MEDICATIONS STARTED DURING THIS VISIT:  ED Discharge Orders     None        Note:  This document was prepared using Dragon voice recognition software and may include unintentional dictation errors.    Don Perking, [place], MD 06/26/21 [id number]

## 2021-06-26 NOTE — H&P (Signed)
Consult History and Physical   SERVICE: Gynecology   Patient Name: [name] Patient MRN:   [id number]  CC: Heavy bleeding with miscarriage  HPI: [name] is a 25 y.o. [phone] with LMP of 01/29/21 and irregular cycles, presenting for the second time to the ED with heavy vaginal bleeding and SAB. U/s consistent with retained POC.  3 pads/1 hr this morning. Intermittent abd pain, no acute peritoneal signs. Normal pregnancy hx,  Beta hcg now 5k, down from 40k 2 weeks ago  EDD 11/05/21   Review of Systems: positives in bold GEN:   fevers, chills, weight changes, appetite changes, fatigue, night sweats HEENT:  HA, vision changes, hearing loss, congestion, rhinorrhea, sinus pressure, dysphagia CV:   CP, palpitations PULM:  SOB, cough GI:  abd pain, N/V/D/C GU:  dysuria, urgency, frequency MSK:  arthralgias, myalgias, back pain, swelling SKIN:  rashes, color changes, pallor NEURO:  numbness, weakness, tingling, seizures, dizziness, tremors PSYCH:  depression, anxiety, behavioral problems, confusion  HEME/LYMPH:  easy bruising or bleeding ENDO:  heat/cold intolerance  Past Obstetrical History: OB History     Gravida  4   Para  3   Term  3   Preterm  0   AB  0   Living  3      SAB  0   IAB  0   Ectopic  0   Multiple  0   Live Births  3        Obstetric Comments  1st Menstrual Cycle:  12  1st Pregnancy: 17         Past Gynecologic History: LMP 01/29/21. Menstrual frequency irregular  Past Medical History: Past Medical History:  Diagnosis Date   Anemia    during 2nd pregnancy    Past Surgical History:   Past Surgical History:  Procedure Laterality Date   CHOLECYSTECTOMY  05/2014    Family History:  family history is not on file.  Social History:  Social History   Socioeconomic History   Marital status: Married    Spouse name: Not on file   Number of children: Not on file   Years of education: Not  on file   Highest education level: Not on file  Occupational History   Not on file  Tobacco Use   Smoking status: Never   Smokeless tobacco: Never  Vaping Use   Vaping Use: Never used  Substance and Sexual Activity   Alcohol use: No   Drug use: No   Sexual activity: Yes    Partners: Male    Birth control/protection: Implant, Pill    Comment: nexplanon removed 04/01/2021, took last ocp 04/30/2021  Other Topics Concern   Not on file  Social History Narrative   Not on file   Social Determinants of Health   Financial Resource Strain: Not on file  Food Insecurity: Not on file  Transportation Needs: Not on file  Physical Activity: Not on file  Stress: Not on file  Social Connections: Not on file  Intimate Partner Violence: Not At Risk   Fear of Current or Ex-Partner: No   Emotionally Abused: No   Physically Abused: No   Sexually Abused: No    Home Medications:  Medications reconciled in EPIC  No current facility-administered medications on file prior to encounter.   Current Outpatient Medications on File Prior to Encounter  Medication Sig Dispense Refill   ferrous sulfate 325 (65 FE) MG EC tablet Take 325 mg by mouth 2 (two) times  daily with a meal. (Patient not taking: No sig reported)     Prenatal Vit-Fe Fumarate-FA (MULTIVITAMIN-PRENATAL) 27-0.8 MG TABS tablet Take 1 tablet by mouth daily at 12 noon. (Patient not taking: No sig reported)     Prenatal Vit-Fe Fumarate-FA (MULTIVITAMIN-PRENATAL) 27-0.8 MG TABS tablet Take 1 tablet by mouth daily at 12 noon. 100 tablet 0    Allergies:  No Known Allergies  Physical Exam:  Temp:  [98.4 F (36.9 C)-98.9 F (37.2 C)] 98.4 F (36.9 C) (08/03 0223) Pulse Rate:  [69-95] 77 (08/03 0700) Resp:  [16-20] 16 (08/03 0630) BP: (85-110)/(55-74) 98/67 (08/03 0700) SpO2:  [99 %-100 %] 100 % (08/03 0700) Weight:  [64.5 kg] 64.5 kg (08/02 2256)   General Appearance:  Well developed, well nourished, no acute distress, alert and  oriented x3 HEENT:  Normocephalic atraumatic, extraocular movements intact, moist mucous membranes Cardiovascular:  Normal S1/S2, regular rate and rhythm, no murmurs Pulmonary:  clear to auscultation, no wheezes, rales or rhonchi, symmetric air entry, good air exchange Abdomen:  Bowel sounds present, soft, nontender, nondistended, no abnormal masses, no epigastric pain Extremities:  Full range of motion, no pedal edema, 2+ distal pulses, no tenderness Skin:  normal coloration and turgor, no rashes, no suspicious skin lesions noted  Neurologic:  Cranial nerves 2-12 grossly intact,  Psychiatric:  Normal mood and affect, appropriate, no AH/VH Pelvic:  NEFG, no vulvar masses or lesions, minimal vaginal bleeding    Labs/Studies:   CBC and Coags:  Lab Results  Component Value Date   WBC 12.2 (H) 06/25/2021   NEUTOPHILPCT 71 06/25/2021   EOSPCT 5 06/25/2021   BASOPCT 0 06/25/2021   LYMPHOPCT 20 06/25/2021   HGB 12.9 06/25/2021   HCT 37.4 06/25/2021   MCV 88.0 06/25/2021   PLT 385 06/25/2021   CMP:  Lab Results  Component Value Date   NA 137 06/25/2021   K 3.7 06/25/2021   CL 108 06/25/2021   CO2 24 06/25/2021   BUN 9 06/25/2021   CREATININE 0.64 06/25/2021   CREATININE 0.68 06/09/2014   CREATININE 0.61 06/08/2014   PROT 6.5 06/09/2014   BILITOT 2.1 (H) 06/09/2014   ALT 252 (H) 06/09/2014   AST 268 (H) 06/09/2014   ALKPHOS 109 06/09/2014   Other Labs:  Lab Results  Component Value Date   HCGBETAQNT 5,074 (H) 06/25/2021   Lab Results  Component Value Date   HCGBETAQNT 5,074 (H) 06/25/2021   HCGBETAQNT 40,079 (H) 06/11/2021    Other Imaging: US OB LESS THAN 14 WEEKS WITH OB TRANSVAGINAL  Result Date: 06/26/2021 CLINICAL DATA:  25 year old female with history of vaginal bleeding and decreasing beta hCG levels. Passing clots vaginally. EXAM: OBSTETRIC <14 WK [place] AND TRANSVAGINAL OB US DOPPLER ULTRASOUND OF OVARIES TECHNIQUE: Both transabdominal and transvaginal ultrasound  examinations were performed for complete evaluation of the gestation as well as the maternal uterus, adnexal regions, and pelvic cul-de-sac. Transvaginal technique was performed to assess early pregnancy. Color and duplex Doppler ultrasound was utilized to evaluate blood flow to the ovaries. COMPARISON:  Ob ultrasound 06/11/2021. FINDINGS: Intrauterine gestational sac: None Yolk sac:  None Embryo:  None Cardiac Activity: None Heart Rate: N/A Subchorionic hemorrhage:  None visualized. Maternal uterus/adnexae: In the fundus of the uterus there is a irregular-shaped hypoechoic structure with some internal septations and low level internal echoes which has increased in size compared to the prior study, currently measuring 1.8 x 1.2 x 2.3 cm, now extending from the endometrial cavity into the adjacent fundal  myometrium posteriorly with surrounding hypervascularity in the adjacent uterus, concerning for retained products of conception. Bilateral ovaries are normal in appearance. Pulsed Doppler evaluation of both ovaries demonstrates normal appearing low-resistance arterial and venous waveforms. IMPRESSION: 1. No viable IUP identified. Structure in the fundal portion of the endometrial cavity is enlarging compared to the prior study, highly concerning for probable retained products of conception. Consultation with OB-Gyn is recommended. Electronically Signed   By: Trudie Reed M.D.   On: 06/26/2021 05:24   US OB LESS THAN 14 WEEKS WITH OB TRANSVAGINAL  Result Date: 06/11/2021 CLINICAL DATA:  Vaginal bleeding EXAM: OBSTETRIC <14 WK [place] AND TRANSVAGINAL OB US TECHNIQUE: Both transabdominal and transvaginal ultrasound examinations were performed for complete evaluation of the gestation as well as the maternal uterus, adnexal regions, and pelvic cul-de-sac. Transvaginal technique was performed to assess early pregnancy. COMPARISON:  None. FINDINGS: Intrauterine gestational sac: Intrauterine irregular fluid collection,  possible gestational sac. Yolk sac:  Not Visualized. Embryo:  Not Visualized. Cardiac Activity: Not Visualized. MSD: 14.7 mm   6 w   2 d Subchorionic hemorrhage: Heterogenous hypoechoic collection adjacent to intrauterine fluid collection probably represents at least moderate subchorionic hemorrhage. Maternal uterus/adnexae: Ovaries are within normal limits. Right ovary measures 2.7 x 1.7 by 1.5 cm. The left ovary measures 1.5 x 2.5 by 1.5 cm. No significant free fluid IMPRESSION: 1. Irregular intrauterine fluid collection, questionable for gestational sac but no embryo or yolk sac identified within. Heterogenous hypoechoic collection adjacent to intrauterine fluid collection, suspicious for at least moderate subchorionic hemorrhage assuming intrauterine fluid collection represents gestational sac. No obvious adnexal mass lesion. Recommend trending of HCG with repeat ultrasound as indicated Electronically Signed   By: Jasmine Pang M.D.   On: 06/11/2021 21:19     Assessment / Plan:   [name] is a 25 y.o. [id number] who presents with retained POC and heavy vaginal bleeding. Hemodynamically stable, h:h stable.  Spontaneous miscarriage: Causes of spontaneous miscarriage discussed with patient, including prevalence, common causes, and the expectation that this event does not increase the chance that she will miscarry again in the future. Emotional support given.  Management options discussed, including: Expectant, medical and surgical.  Pt has opted for: suction D&C  Consents signed today. Risks of surgery were discussed with the patient including but not limited to: bleeding which may require transfusion; infection which may require antibiotics; injury to uterus or surrounding organs; intrauterine scarring which may impair future fertility; need for additional procedures including laparotomy or laparoscopy; and other postoperative/anesthesia complications. Written informed consent was  obtained.  This is a scheduled same-day surgery. She will have a postop visit in 2 weeks to review operative findings and pathology.

## 2021-06-26 NOTE — ED Notes (Signed)
Patient transported to US 

## 2021-06-26 NOTE — Discharge Instructions (Addendum)
Discharge instructions after a Dilation and Curettage  Signs and Symptoms to Report  Call our office at [phone] if you have any of the following:    Fever over 100.4 degrees or higher  Severe stomach pain not relieved with pain medications  Bright red bleeding that's heavier than a period that does not slow with rest after the first 24 hours  To go the bathroom a lot (frequency), you can't hold your urine (urgency), or it hurts when you empty your bladder (urinate)  Chest pain  Shortness of breath  Pain in the calves of your legs  Severe nausea and vomiting not relieved with anti-nausea medications  Any concerns  What You Can Expect after Surgery  You may see some pink tinged, bloody fluid. This is normal. You may also have cramping for several days.   Activities after Your Discharge Follow these guidelines to help speed your recovery at home:  Don't drive if you are in pain or taking narcotic pain medicine. You may drive when you can safely slam on the brakes, turn the wheel forcefully, and rotate your torso comfortably. This is typically 4-7 days. Practice in a parking lot or side street prior to attempting to drive regularly.   Ask others to help with household chores until you feel up to doing tasks.  Don't do strenuous activities, exercises, or sports like vacuuming, tennis, squash, etc. until your doctor says it is safe to do so.  Walk as you feel able. Rest often since it may take a week or two for your energy level to return to normal.   You may climb stairs  Avoid constipation:   -Eat fruits, vegetables, and whole grains. Eat small meals as your appetite will take time to return to normal.   -Drink 6 to 8 glasses of water each day unless your doctor has told you to limit your fluids.   -Use a laxative or stool softener as needed if constipation becomes a problem. You may take Miralax, metamucil, Citrucil, Colace, Senekot, FiberCon, etc. If this does not relieve the  constipation, try two tablespoons of Milk Of Magnesia every 8 hours until your bowels move.   You may shower.   Do not get in a hot tub, swimming pool, etc. until your doctor agrees.  Do not douche, use tampons, or have sex until you stop spotting, usually about 2 weeks.  Take your pain medicine when you need it. The medicine may not work as well if the pain is bad.  Take the medicines you were taking before surgery. Other medications you might need are pain medications (ibuprofen), medications for constipation (Colace) and nausea medications (Zofran).       Coping with Pregnancy Loss Pregnancy loss can happen any time during a pregnancy. Often the cause is not known. It is rarely because of anything you did. Pregnancy loss in early pregnancy (during the first trimester) is called a miscarriage. This type of pregnancy loss is the most common.   Any pregnancy loss can be devastating. You will need to recover both physically and emotionally. Most women are able to get pregnant again after a pregnancy loss and deliver a healthy baby.  How to manage emotional recovery  Pregnancy loss is very hard emotionally. You may feel many different emotions while you grieve. You may feel sad and angry. You may also feel guilty. It is normal to have periods of crying. Emotional recovery can take longer than physical recovery. It is different for everyone.  Some women may feel back to normal quickly and others take longer. Taking these steps can help you cope: Remember that it is unlikely you did anything to cause the pregnancy loss. Share your thoughts and feelings with friends, family, and your partner. Remember that your partner is also recovering emotionally. Make sure you have a good support system, and do not spend too much time alone. Meet with a pregnancy loss counselor or join a pregnancy loss support group. Get enough sleep and eat a healthy diet. Return to regular exercise when you have recovered  physically. Do not use drugs or alcohol to manage your emotions. Consider seeing a mental health professional to help you recover emotionally. Ask a friend or loved one to help you decide what to do with any clothing and nursery items you received for your baby.  How to recognize emotional stress It is normal to have emotional stress after a pregnancy loss. But emotional stress that lasts a long time or becomes severe requires treatment. Watch out for these signs of severe emotional stress: Sadness, anger, or guilt that is not going away and is interfering with your normal activities. Relationship problems that have occurred or gotten worse since the pregnancy loss. Signs of depression that last longer than 2 weeks. These may include: Sadness. Anxiety. Hopelessness. Loss of interest in activities you enjoy. Inability to concentrate. Trouble sleeping or sleeping too much. Loss of appetite or overeating. Thoughts of death or of hurting yourself. Follow these instructions at home: Medicines Take over-the-counter and prescription medicines only as told by your health care provider. Activity Rest at home until your energy level returns. Return to your normal activities as told by your health care provider. Ask your health care provider what activities are safe for you. General instructions Keep all follow-up visits as told by your health care provider. This is important. It may be helpful to meet with others who have experienced pregnancy loss. Ask your health care provider about support groups and resources. To help you and your partner with the process of grieving, talk with your health care provider or seek counseling. When you are ready, meet with your health care provider to discuss steps to take for a future pregnancy. Where to find more information [place] Department of Health and Programmer, systems on Women's Health: [place] American Pregnancy Association:  www.americanpregnancy.org Contact a health care provider if: You continue to experience grief, sadness, or lack of motivation for everyday activities, and those feelings do not improve over time. You are struggling to recover emotionally, especially if you are using alcohol or substances to help. Get help right away if: You have thoughts of hurting yourself or others. If you ever feel like you may hurt yourself or others, or have thoughts about taking your own life, get help right away. You can go to your nearest emergency department or call: Your local emergency services (911 in the [place] A suicide crisis helpline, such as the [facility] at 905-626-8818. This is open 24 hours a day. Summary Any pregnancy loss can be difficult physically and emotionally. You may experience many different emotions while you grieve. Emotional recovery can last longer than physical recovery. It is normal to have emotional stress after a pregnancy loss. But emotional stress that lasts a long time or becomes severe requires treatment. See your health care provider if you are struggling emotionally after a pregnancy loss. This information is not intended to replace advice given to you by your health  care provider. Make sure you discuss any questions you have with your health care provider. Document Released: 01/21/2018 Document Revised: 01/21/2018 Document Reviewed: 01/21/2018 Elsevier Interactive Patient Education  2019 Elsevier Inc.       CIRUGIA AMBULATORIA       Instruccionnes de alta    Date Franco Nones) 06/26/21   1.  Las drogas que se Dispensing optician en su cuerpo [employer], asi            que por las proximas 24 horas usted no debe:   Conducir Field seismologist) un automovil   Hacer ninguna decision legal   Tomar ninguna bebida alcoholica  2.  A) [place] puede comenzar una dieta regular.  Es mejor que hoy empiece con           liquidos y gradualmente anada [place].       B) Puede comer cualquier comida que desee pero es mejor empezar con liquidos,                      luego sopitas con galletas saladas y gradualmente llegar a las comidas solidas.  3.  Por favor avise a su medico inmediatamente si usted tiene algun sangrado anormal,       tiene dificultad con la respiracion, enrojecimiento y Engineer, mining en el sitio de la cirugia, [place],       fiebro o dolor que se alivia con [place].  4.  A) Su visita posoperatoria (despues de su operacion) es con el  Dr. Dalbert Garnet  Date   07/11/21                 Time 1:45 pm        B)  Por favor llame para hacer la cita posoperatoria.  5.  Istrucciones especificas :

## 2021-06-27 LAB — SURGICAL PATHOLOGY

## 2022-02-03 IMAGING — US US OB < 14 WEEKS - US OB TV
1 series · 13 of 28 positions shown · non-contrast
Comparison: None.

CLINICAL DATA: Vaginal bleeding

EXAM:
OBSTETRIC <14 WK US AND TRANSVAGINAL OB US
TECHNIQUE: Both transabdominal and transvaginal ultrasound examinations were
performed for complete evaluation of the gestation as well as the
maternal uterus, adnexal regions, and pelvic cul-de-sac.
Transvaginal technique was performed to assess early pregnancy.

[Series 1: us ob less than 14 weeks with ob transvaginal · 13 of 258 slices shown]
[im 10/258]
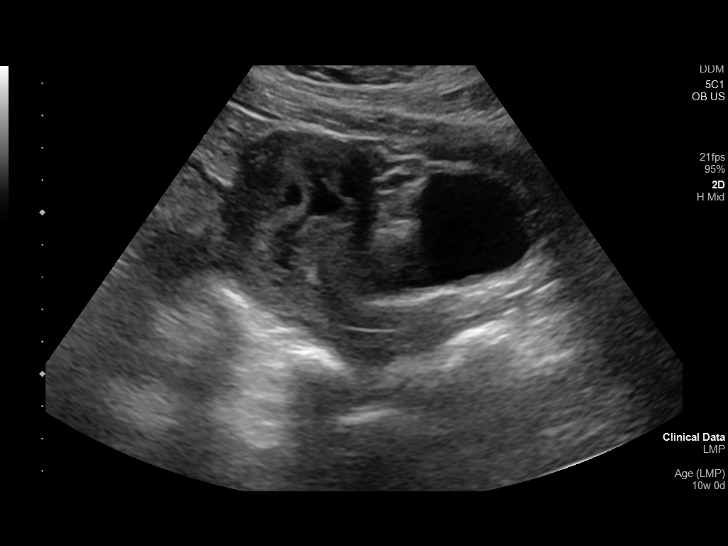
[im 29/258]
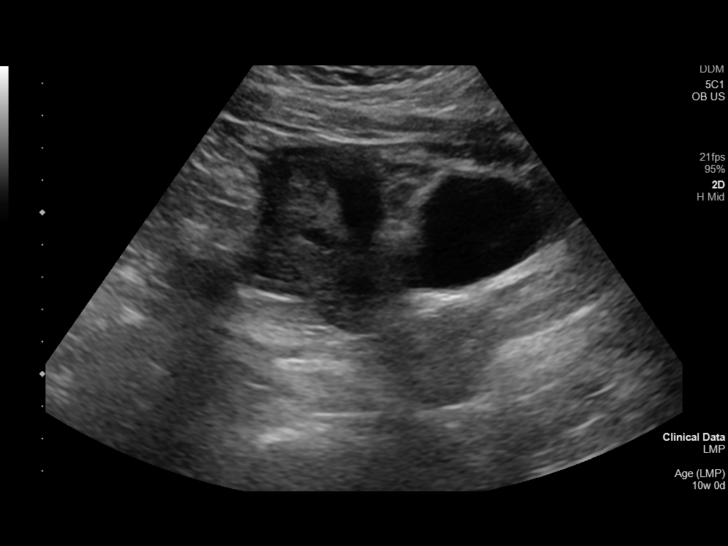
[im 48/258]
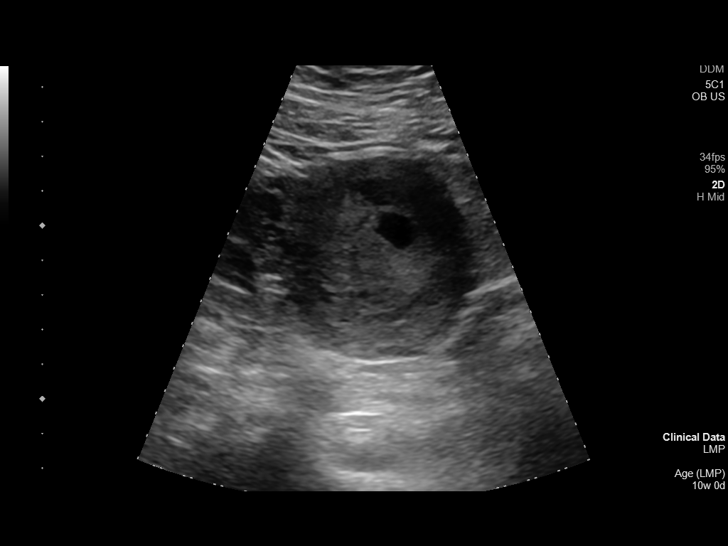
[im 67/258]
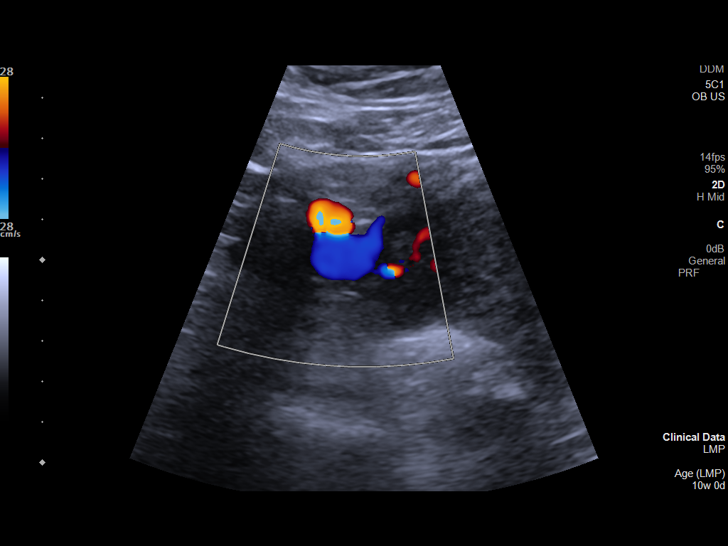
[im 86/258]
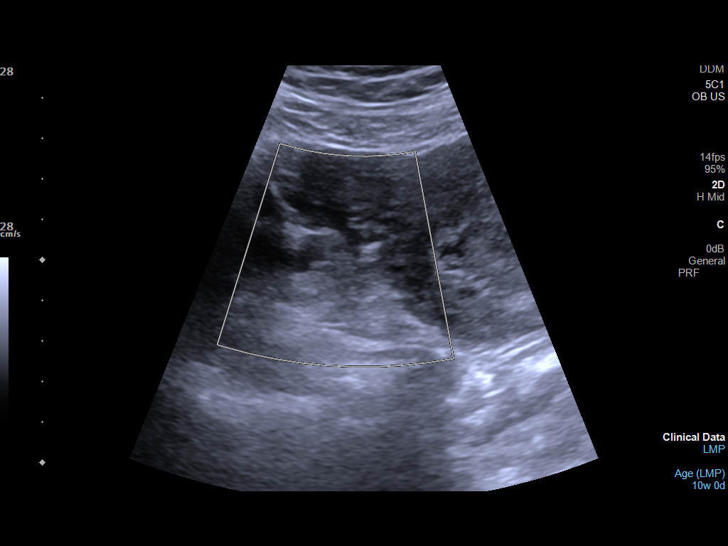
[im 105/258]
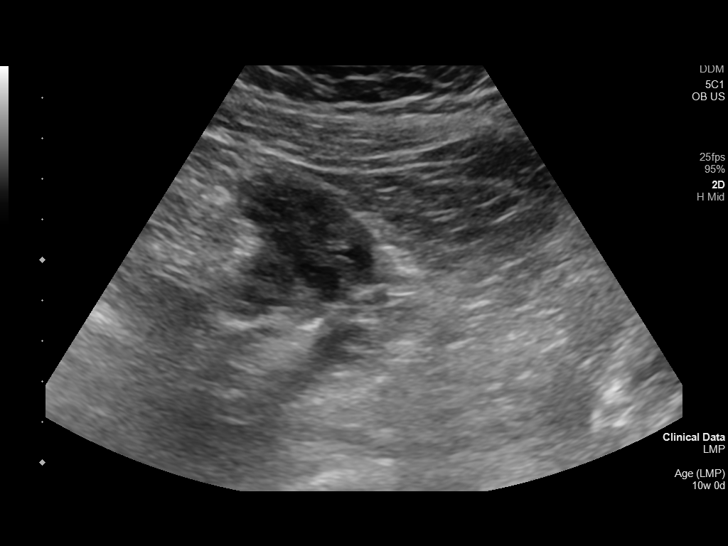
[im 134/258]
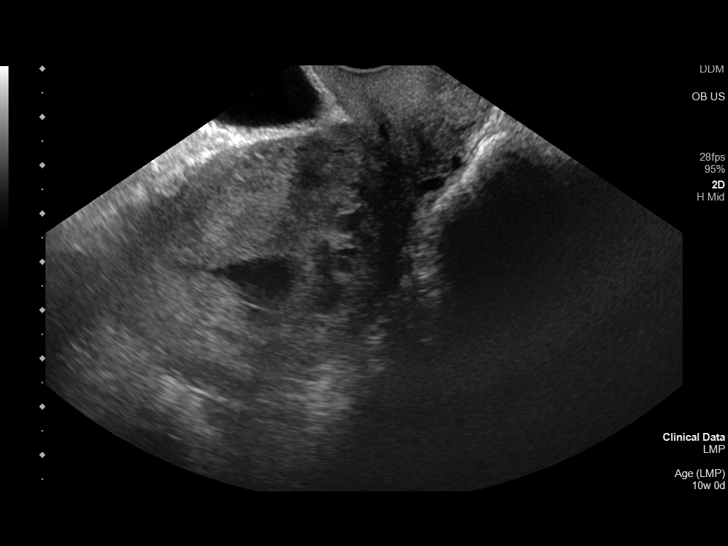
[im 153/258]
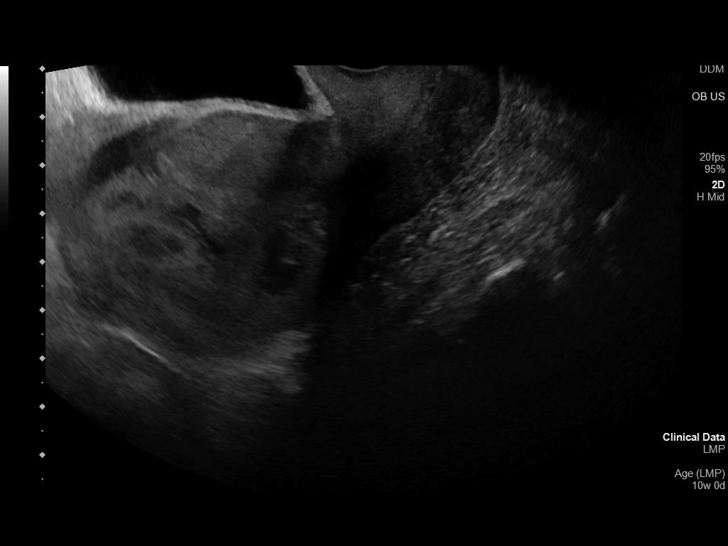
[im 172/258]
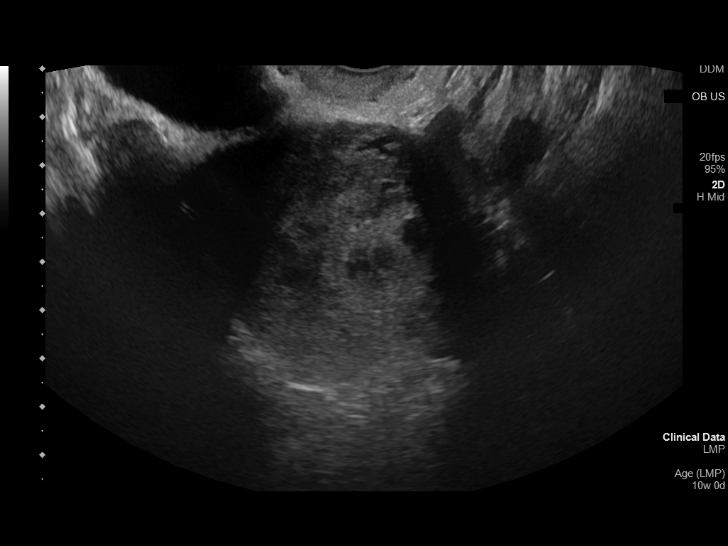
[im 191/258]
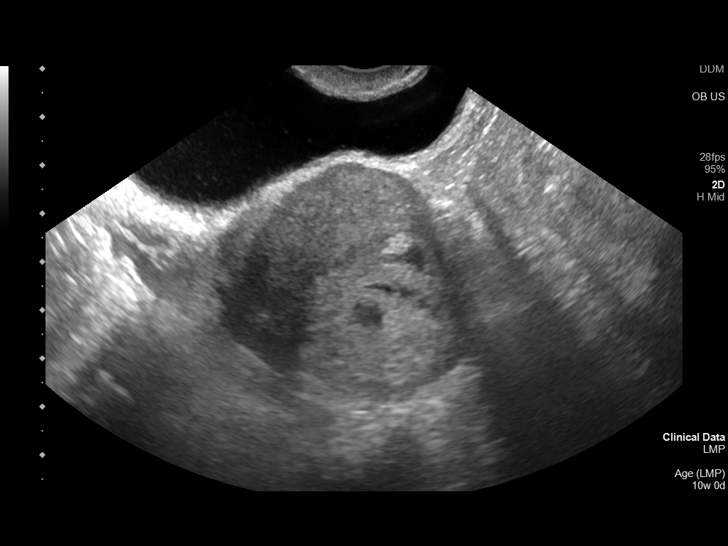
[im 210/258]
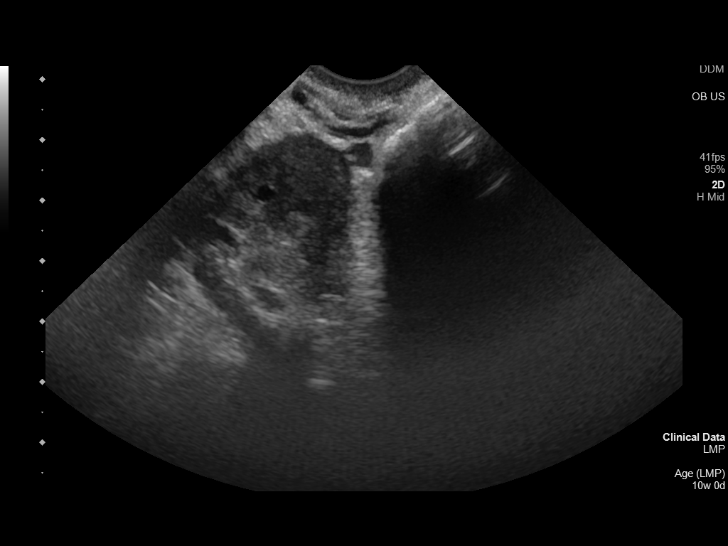
[im 229/258]
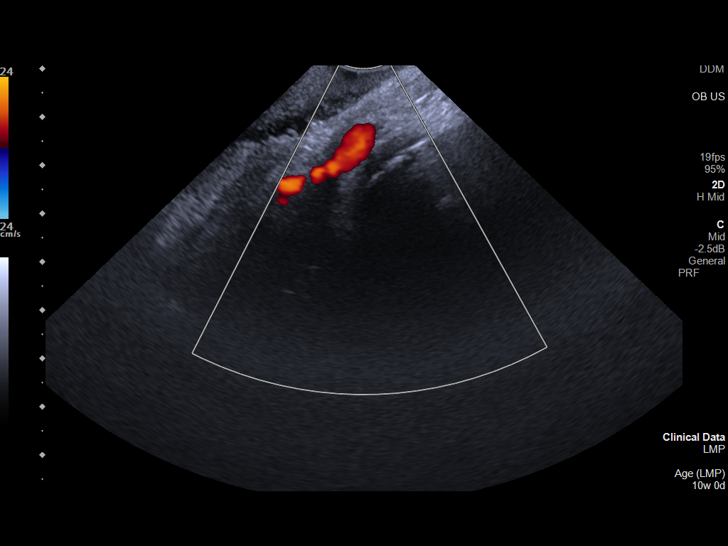
[im 248/258]
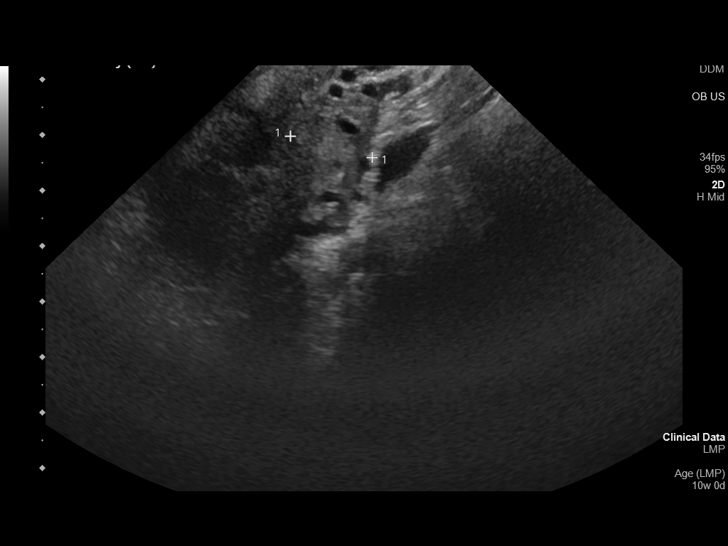

[13 of 28 positions shown; findings below may reference images not displayed]

FINDINGS: Intrauterine gestational sac: Intrauterine irregular fluid
collection, possible gestational sac.

Yolk sac:  Not Visualized.

Embryo:  Not Visualized.

Cardiac Activity: Not Visualized.

MSD: 14.7 mm   6 w   2 d

Subchorionic hemorrhage: Heterogenous hypoechoic collection adjacent
to intrauterine fluid collection probably represents at least
moderate subchorionic hemorrhage.

Maternal uterus/adnexae: Ovaries are within normal limits. Right
ovary measures 2.7 x 1.7 by 1.5 cm. The left ovary measures 1.5 x
2.5 by 1.5 cm. No significant free fluid
IMPRESSION: 1. Irregular intrauterine fluid collection, questionable for
gestational sac but no embryo or yolk sac identified within.
Heterogenous hypoechoic collection adjacent to intrauterine fluid
collection, suspicious for at least moderate subchorionic hemorrhage
assuming intrauterine fluid collection represents gestational sac.
No obvious adnexal mass lesion. Recommend trending of HCG with
repeat ultrasound as indicated

## 2022-02-18 IMAGING — US US OB < 14 WEEKS - US OB TV
1 series · 13 of 28 positions shown · non-contrast
Comparison: Ob ultrasound 06/11/2021.

CLINICAL DATA: 24-year-old female with history of vaginal bleeding
and decreasing beta hCG levels. Passing clots vaginally.



[Series 1: us ob less than 14 weeks with ob transvaginal · 13 of 104 slices shown]
[im 4/104]
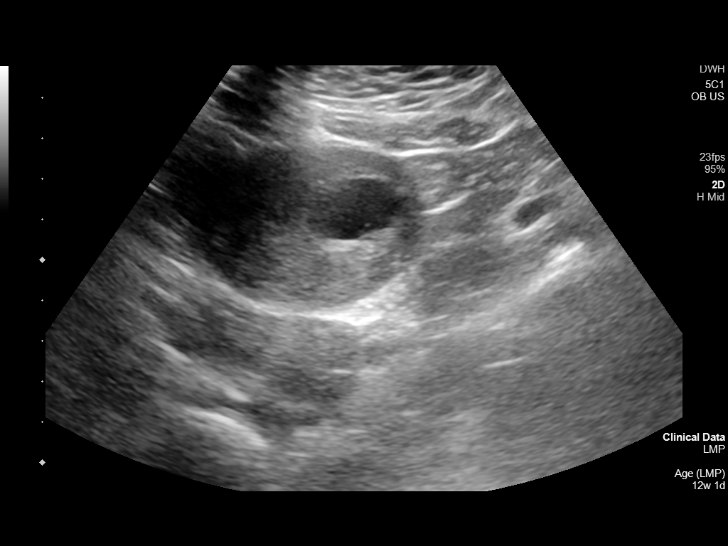
[im 12/104]
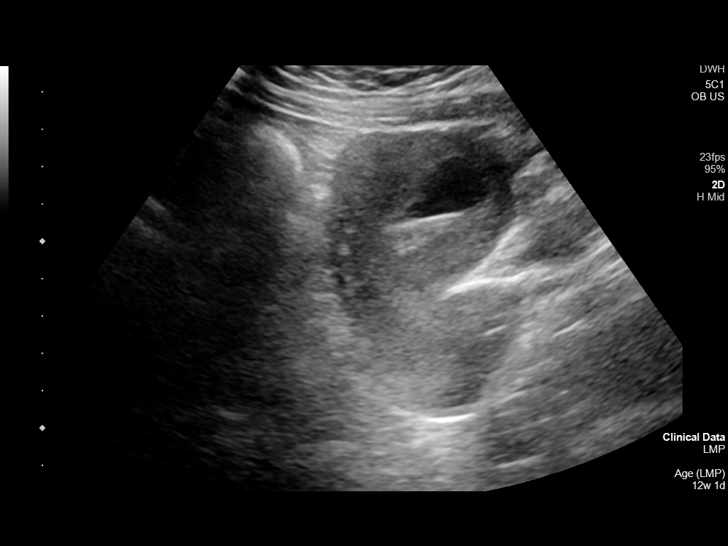
[im 20/104]
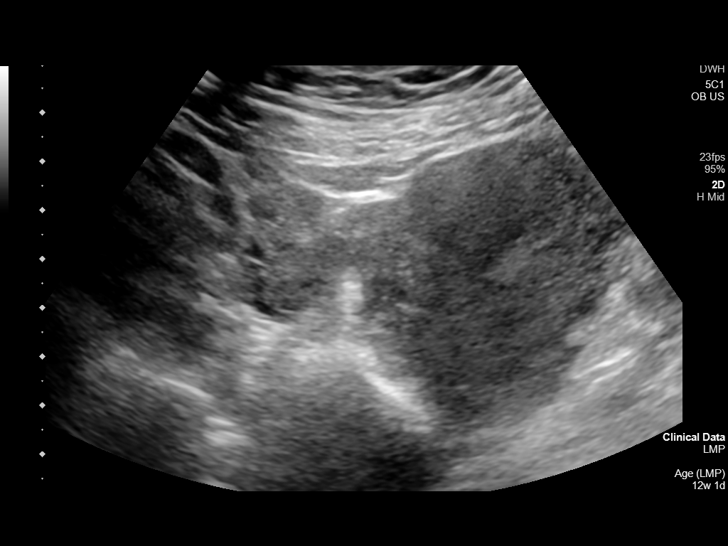
[im 27/104]
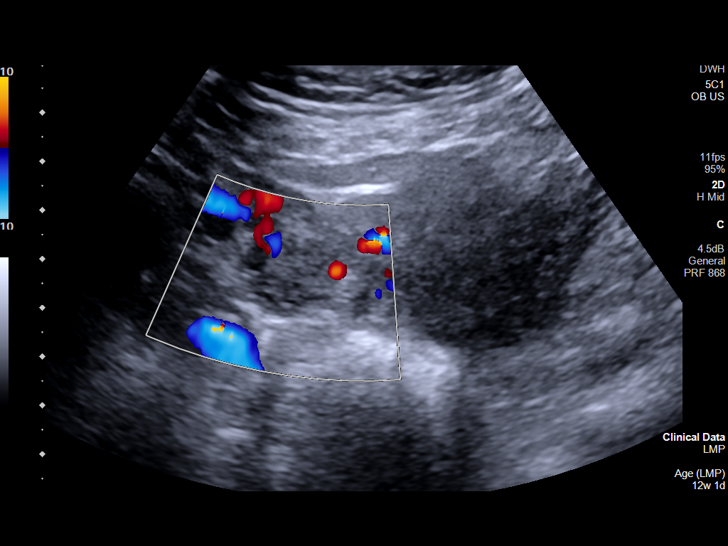
[im 35/104]
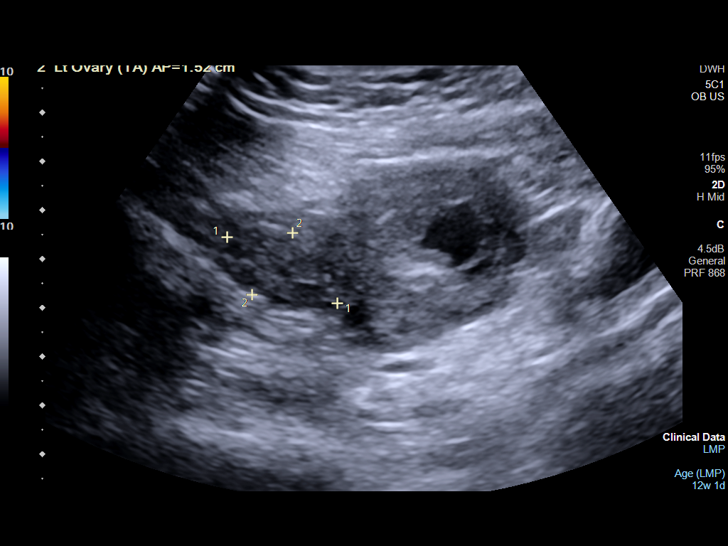
[im 42/104]
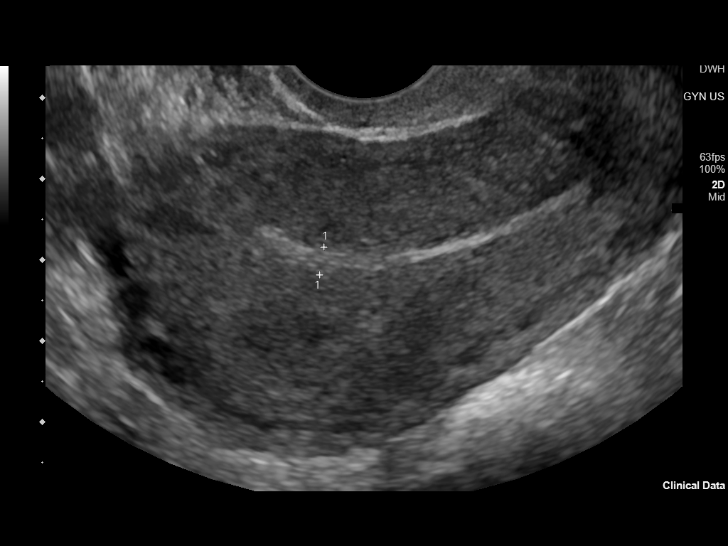
[im 54/104]
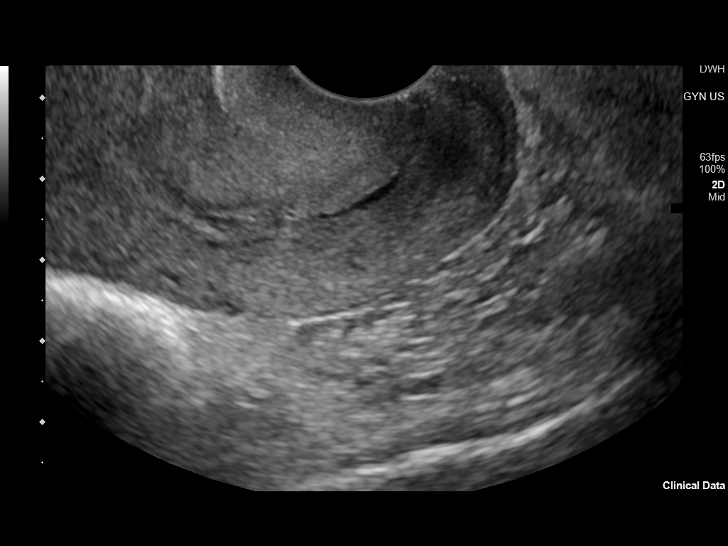
[im 62/104]
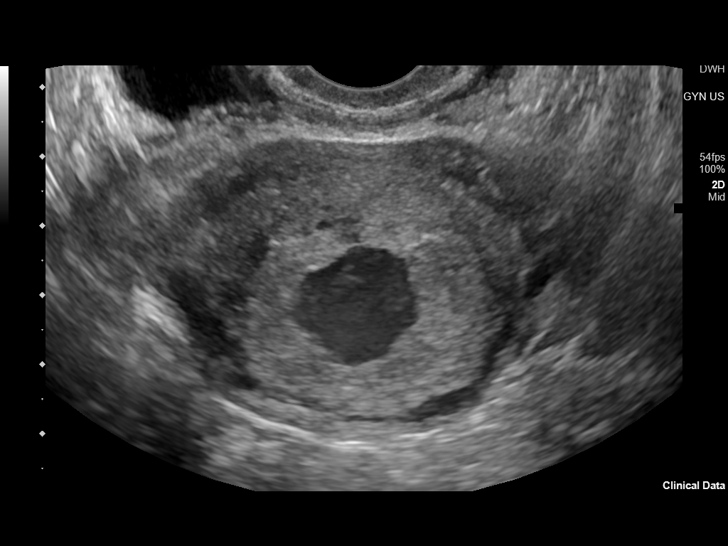
[im 69/104]
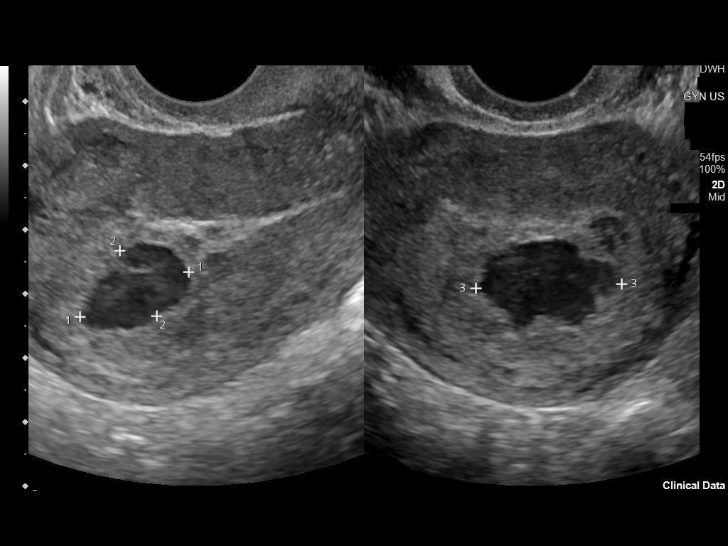
[im 77/104]
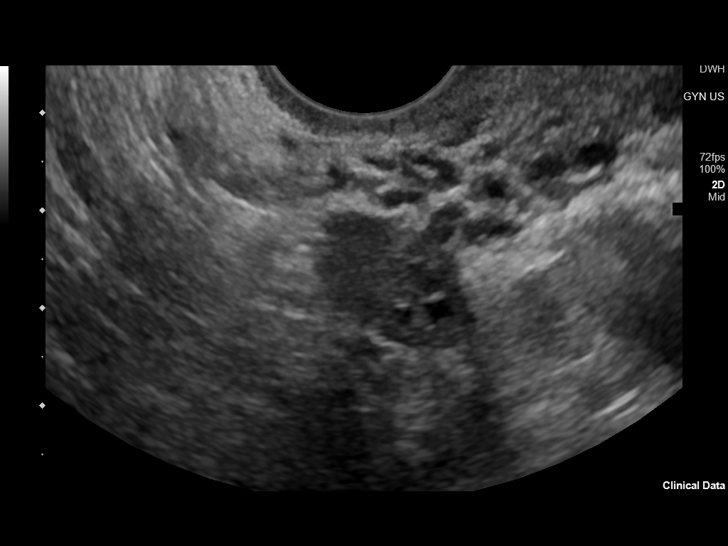
[im 84/104]
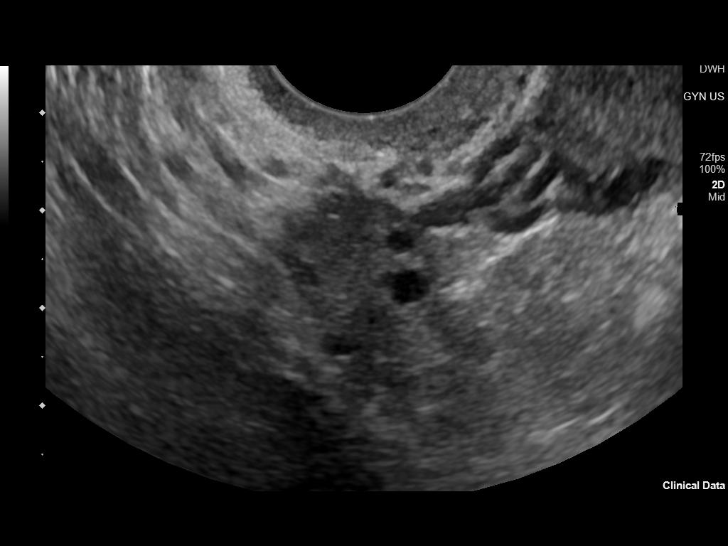
[im 92/104]
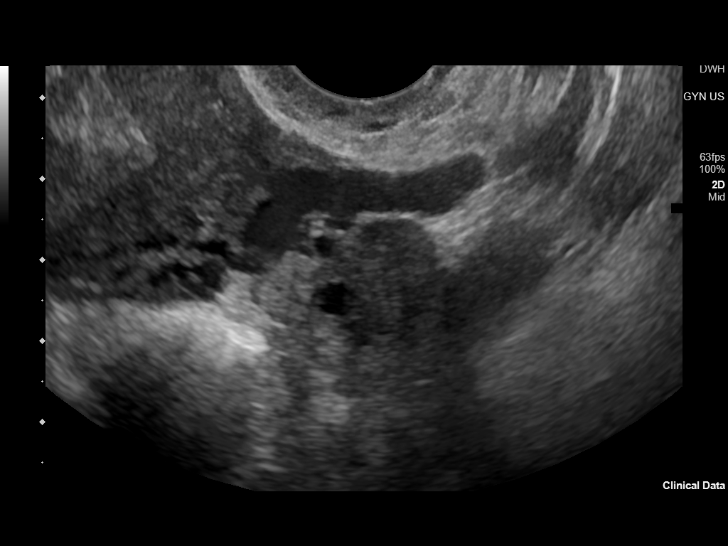
[im 100/104]
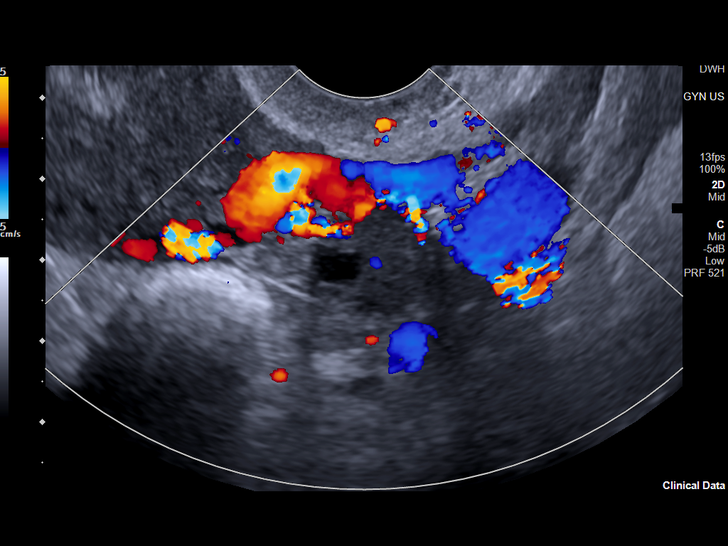

[13 of 28 positions shown; findings below may reference images not displayed]

FINDINGS: Intrauterine gestational sac: None

Yolk sac:  None

Embryo:  None

Cardiac Activity: None

Heart Rate: N/A

Subchorionic hemorrhage:  None visualized.

Maternal uterus/adnexae: In the fundus of the uterus there is a
irregular-shaped hypoechoic structure with some internal septations
and low level internal echoes which has increased in size compared
to the prior study, currently measuring 1.8 x 1.2 x 2.3 cm, now
extending from the endometrial cavity into the adjacent fundal
myometrium posteriorly with surrounding hypervascularity in the
adjacent uterus, concerning for retained products of conception.
Bilateral ovaries are normal in appearance.

Pulsed Doppler evaluation of both ovaries demonstrates normal
appearing low-resistance arterial and venous waveforms.
IMPRESSION: 1. No viable IUP identified. Structure in the fundal portion of the
endometrial cavity is enlarging compared to the prior study, highly
concerning for probable retained products of conception.
Consultation with OB-Gyn is recommended.

## 2022-02-27 ENCOUNTER — Other Ambulatory Visit: Payer: Self-pay

## 2022-02-27 ENCOUNTER — Emergency Department
Admission: EM | Admit: 2022-02-27 | Discharge: 2022-02-28 | Disposition: A | Discharge status: Home / Self Care | Payer: Self-pay | Attending: Emergency Medicine | Admitting: Emergency Medicine

## 2022-02-27 ENCOUNTER — Encounter: Payer: Self-pay | Admitting: Emergency Medicine

## 2022-02-27 DIAGNOSIS — R42 Dizziness and giddiness: Secondary | ICD-10-CM | POA: Insufficient documentation

## 2022-02-27 DIAGNOSIS — R55 Syncope and collapse: Secondary | ICD-10-CM

## 2022-02-27 DIAGNOSIS — R519 Headache, unspecified: Secondary | ICD-10-CM | POA: Insufficient documentation

## 2022-02-27 DIAGNOSIS — R110 Nausea: Secondary | ICD-10-CM | POA: Insufficient documentation

## 2022-02-27 DIAGNOSIS — N9489 Other specified conditions associated with female genital organs and menstrual cycle: Secondary | ICD-10-CM | POA: Insufficient documentation

## 2022-02-27 DIAGNOSIS — Z3201 Encounter for pregnancy test, result positive: Secondary | ICD-10-CM

## 2022-02-27 LAB — CBC
HCT: 42.8 % (ref 36.0–46.0)
Hemoglobin: 14 g/dL (ref 12.0–15.0)
MCH: 27.2 pg (ref 26.0–34.0)
MCHC: 32.7 g/dL (ref 30.0–36.0)
MCV: 83.3 fL (ref 80.0–100.0)
Platelets: 402 10*3/uL — ABNORMAL HIGH (ref 150–400)
RBC: 5.14 MIL/uL — ABNORMAL HIGH (ref 3.87–5.11)
RDW: 13.4 % (ref 11.5–15.5)
WBC: 12.2 10*3/uL — ABNORMAL HIGH (ref 4.0–10.5)
nRBC: 0 % (ref 0.0–0.2)

## 2022-02-27 LAB — URINALYSIS, ROUTINE W REFLEX MICROSCOPIC
Bilirubin Urine: NEGATIVE
Glucose, UA: NEGATIVE mg/dL
Hgb urine dipstick: NEGATIVE
Ketones, ur: NEGATIVE mg/dL
Nitrite: NEGATIVE
Protein, ur: NEGATIVE mg/dL
Specific Gravity, Urine: 1.025 (ref 1.005–1.030)
pH: 6 (ref 5.0–8.0)

## 2022-02-27 LAB — POC URINE PREG, ED: Preg Test, Ur: NEGATIVE

## 2022-02-27 LAB — CBG MONITORING, ED: Glucose-Capillary: 110 mg/dL — ABNORMAL HIGH (ref 70–99)

## 2022-02-27 LAB — BASIC METABOLIC PANEL
Anion gap: 9 (ref 5–15)
BUN: 12 mg/dL (ref 6–20)
CO2: 22 mmol/L (ref 22–32)
Calcium: 9.3 mg/dL (ref 8.9–10.3)
Chloride: 106 mmol/L (ref 98–111)
Creatinine, Ser: 0.52 mg/dL (ref 0.44–1.00)
GFR, Estimated: >60 mL/min (ref >60)
Glucose, Bld: 99 mg/dL (ref 70–99)
Potassium: 3.6 mmol/L (ref 3.5–5.1)
Sodium: 137 mmol/L (ref 135–145)

## 2022-02-27 LAB — TROPONIN I (HIGH SENSITIVITY): Troponin I (High Sensitivity): <2 ng/L (ref <18)

## 2022-02-27 MED ORDER — IBUPROFEN 600 MG PO TABS
600.0000 mg | ORAL_TABLET | Freq: Once | ORAL | Status: AC
  Administered 2022-02-27: 600 mg via ORAL
  Filled 2022-02-27: qty 1

## 2022-02-27 MED ORDER — ONDANSETRON HCL 4 MG/2ML IJ SOLN
4.0000 mg | Freq: Once | INTRAMUSCULAR | Status: AC
  Administered 2022-02-27: 4 mg via INTRAVENOUS
  Filled 2022-02-27: qty 2

## 2022-02-27 MED ORDER — SODIUM CHLORIDE 0.9 % IV BOLUS
1000.0000 mL | Freq: Once | INTRAVENOUS | Status: AC
  Administered 2022-02-27: 1000 mL via INTRAVENOUS

## 2022-02-27 NOTE — ED Triage Notes (Signed)
Pt presents via POV with complaints of dizziness starting today with associated SHOB. Denies CP, cough, fever, or sore throat. ?

## 2022-02-27 NOTE — ED Notes (Signed)
Pt c/o dizziness that started today. Pt states she was standing up and felt like she was going to pass out. Pt denies any falls and cardiac issues. Pt NAD.  ?

## 2022-02-27 NOTE — ED Notes (Signed)
This RN obtained orthostatic vital. Pt denies c/o dizziness upon standing.  ?

## 2022-02-27 NOTE — ED Provider Notes (Signed)
? ?[facility] ?Provider Note ? ? ? Event Date/Time  ? First MD Initiated Contact with Patient 02/27/22 2255   ?  (approximate) ? ? ?History  ? ?Dizziness ? ? ?HPI ? ?[name] is a 26 y.o. female with no significant past medical history who presents for evaluation of dizziness.  Patient reports that her symptoms started this morning.  She reports having 3 episodes of feeling lightheaded like she was going to pass out.  All 3 episodes happen while she was standing and walking.  They were short-lived and resolved when she sat down.  She also reports that she woke up with a mild frontal throbbing 3 out of 10 headache.  She does have a history of having similar headaches in the past.  She denies diplopia, dysarthria, dysphagia, thunderclap headache, severe headache, facial droop, slurred speech, gait instability, unilateral weakness or numbness.  She also denies cough, congestion, sore throat, fever.  She denies chest pain, abdominal pain, vomiting, diarrhea, dysuria or hematuria.  She does report mild nausea.  No vaginal bleeding.  Triage note says the patient was complaining of shortness of breath patient denies shortness of breath.  No alcohol use, no drug use.    ? ? ?Past Medical History:  ?Diagnosis Date  ? Anemia   ? during 2nd pregnancy  ? ? ?Past Surgical History:  ?Procedure Laterality Date  ? CHOLECYSTECTOMY  05/2014  ? DILATION AND EVACUATION N/A 06/26/2021  ? Procedure: DILATATION AND EVACUATION;  Surgeon: Christeen DouglasBeasley, Bethany, MD;  Location: [facility] ORS;  Service: Gynecology;  Laterality: N/A;  ? ? ? ?Physical Exam  ? ?Triage Vital Signs: ?ED Triage Vitals  ?Enc Vitals Group  ?   BP 02/27/22 2217 127/85  ?   Pulse Rate 02/27/22 2217 91  ?   Resp 02/27/22 2217 18  ?   Temp 02/27/22 2217 98 ?F (36.7 ?C)  ?   Temp src --   ?   SpO2 02/27/22 2217 96 %  ?   Weight 02/27/22 2216 139 lb (63 kg)  ?   Height 02/27/22 2216 5' (1.524 m)  ?   Head Circumference --   ?   Peak Flow --    ?   Pain Score 02/27/22 2216 0  ?   Pain Loc --   ?   Pain Edu? --   ?   Excl. in [place]? --   ? ? ?Most recent vital signs: ?Vitals:  ? 02/27/22 2300 02/27/22 2345  ?BP: 110/75 111/83  ?Pulse: 83 77  ?Resp: 20 20  ?Temp:    ?SpO2: 100% 100%  ? ? ? ?Constitutional: Alert and oriented. Well appearing and in no apparent distress. ?HEENT: ?     Head: Normocephalic and atraumatic.    ?     Eyes: Conjunctivae are normal. Sclera is non-icteric. PERRL, EOMI ?     Mouth/Throat: Mucous membranes are moist.  ?     Neck: Supple with no signs of meningismus. ?Cardiovascular: Regular rate and rhythm. No murmurs, gallops, or rubs. 2+ symmetrical distal pulses are present in all extremities.  ?Respiratory: Normal respiratory effort. Lungs are clear to auscultation bilaterally.  ?Gastrointestinal: Soft, non tender, and non distended with positive bowel sounds. No rebound or guarding. ?Genitourinary: No CVA tenderness. ?Musculoskeletal:  No edema, cyanosis, or erythema of extremities. ?Neurologic: Normal speech and language. Face is symmetric. Moving all extremities. No gross focal neurologic deficits are appreciated. Normal gait ?Skin: Skin is warm, dry and intact. No  rash noted. ?Psychiatric: Mood and affect are normal. Speech and behavior are normal. ? ?ED Results / Procedures / Treatments  ? ?Labs ?(all labs ordered are listed, but only abnormal results are displayed) ?Labs Reviewed  ?CBC - Abnormal; Notable for the following components:  ?    Result Value  ? WBC 12.2 (*)   ? RBC 5.14 (*)   ? Platelets 402 (*)   ? All other components within normal limits  ?URINALYSIS, ROUTINE W REFLEX MICROSCOPIC - Abnormal; Notable for the following components:  ? Color, Urine YELLOW (*)   ? APPearance CLOUDY (*)   ? Leukocytes,Ua LARGE (*)   ? Bacteria, UA RARE (*)   ? All other components within normal limits  ?HCG, QUANTITATIVE, PREGNANCY - Abnormal; Notable for the following components:  ? hCG, Beta Chain, Quant, S 41 (*)   ? All other  components within normal limits  ?CBG MONITORING, ED - Abnormal; Notable for the following components:  ? Glucose-Capillary 110 (*)   ? All other components within normal limits  ?BASIC METABOLIC PANEL  ?POC URINE PREG, ED  ?TROPONIN I (HIGH SENSITIVITY)  ?TROPONIN I (HIGH SENSITIVITY)  ? ? ? ?EKG ? ?ED ECG REPORT ?I, Nita Sickle, the attending physician, personally viewed and interpreted this ECG. ? ?NSR, normal intervals, normal axis, no STE or depressions, no evidence of HOCM, AV block, delta wave, ARVD, prolonged QTc, WPW, or [place]. ? ? ? ?RADIOLOGY ?none ? ? ?PROCEDURES: ? ?Critical Care performed: No ? ?Procedures ? ? ? ?IMPRESSION / MDM / ASSESSMENT AND PLAN / ED COURSE  ?I reviewed the triage vital signs and the nursing notes. ? ? 26 y.o. female with no significant past medical history who presents for evaluation of dizziness.  Patient with 3 episodes of feeling lightheaded today.  Also with a mild frontal headache.  On exam she is extremely well-appearing in no distress with normal vital signs, completely neurologically intact.  Normal work of breathing normal sats, abdomen is soft and nontender. ? ?Ddx: Dehydration versus anemia versus dysrhythmia versus orthostasis versus vasovagal versus vertigo versus UTI versus pregnancy ? ? ?Plan: Patient placed on telemetry for monitoring for any signs of dysrhythmias.  We will get an EKG, CBC, metabolic panel, pregnancy test, urinalysis.  Will check orthostasis.  Will give IV fluids, Zofran, and ibuprofen for symptoms. ? ? ?MEDICATIONS GIVEN IN ED: ?Medications  ?sodium chloride 0.9 % bolus 1,000 mL (1,000 mLs Intravenous New Bag/Given 02/27/22 2351)  ?ondansetron [facility]) injection 4 mg (4 mg Intravenous Given 02/27/22 2354)  ?ibuprofen (ADVIL) tablet 600 mg (600 mg Oral Given 02/27/22 2355)  ? ? ? ?ED COURSE: Orthostatic vital signs were negative.  Patient was medicated by RN.  After receiving medications patient told the RN that the main reason why she is here  is because she took 2 home pregnancy tests which were positive and she is concerned that she might be pregnant.  She is requesting a blood pregnancy test.  That has been added on.  Unfortunately patient had already received ibuprofen after mentioning that and with a negative urine pregnancy test there was no contraindication.  Orthostatic vital signs were negative.  Blood work with no signs of anemia, dehydration, AKI, electrolyte derangements.  She was monitored on telemetry with no signs of dysrhythmias.  After IV fluids she felt markedly improved.  hCG is 41.  Discussed with the patient that if she had a pregnancy test at home that was positive and now her hCG is  41 this is most likely a failed pregnancy however did recommend repeat hCG levels in 7 days as she could be pregnant but very early on.  With no abdominal pain, abdominal tenderness, or vaginal bleeding there is no indication for an ultrasound at this time as he will most likely not show anything.  Recommended return to the hospital for abdominal pain, bleeding, chest pain, or any further episodes of near syncope/syncope.  With negative work-up and symptoms that have now resolved and there is no indication for admission although that was considered. ? ? ?Consults: None ? ? ?EMR reviewed including patient's last admission to the hospital from August 2022 for routine products of conception after miscarriage ? ? ? ?FINAL CLINICAL IMPRESSION(S) / ED DIAGNOSES  ? ?Final diagnoses:  ?Near syncope  ?Positive pregnancy test  ? ? ? ?Rx / DC Orders  ? ?ED Discharge Orders   ? ? None  ? ?  ? ? ? ?Note:  This document was prepared using Dragon voice recognition software and may include unintentional dictation errors. ? ? ?Please note:  Patient was evaluated in Emergency Department today for the symptoms described in the history of present illness. Patient was evaluated in the context of the global COVID-19 pandemic, which necessitated consideration that the patient  might be at risk for infection with the SARS-CoV-2 virus that causes COVID-19. Institutional protocols and algorithms that pertain to the evaluation of patients at risk for COVID-19 are in a state of rapid c

## 2022-02-28 LAB — HCG, QUANTITATIVE, PREGNANCY: hCG, Beta Chain, Quant, S: 41 m[IU]/mL — ABNORMAL HIGH (ref <5)

## 2022-02-28 NOTE — Discharge Instructions (Addendum)
As we discussed, your pregnancy hormones are barely positive.  Therefore it is very important that you follow-up with your primary care doctor in a week for repeat blood work.  If you start having abdominal pain or vaginal bleeding please return to the hospital.  Otherwise follow-up with your primary care doctor. ?

## 2022-02-28 NOTE — ED Notes (Signed)
Pt verbalized understanding of discharge instructions and follow-up care instructions. Pt advised if symptoms worsen to return to ED.  

## 2022-03-07 ENCOUNTER — Other Ambulatory Visit: Payer: Self-pay

## 2022-03-07 DIAGNOSIS — O200 Threatened abortion: Secondary | ICD-10-CM

## 2022-03-07 NOTE — Progress Notes (Addendum)
Presents to nurse clinic for Beta HCG per order by Hazle Coca, CNM, dated 03/04/22. Informed pt that [facility] would notify her of results.  Walked pt to lab.  Cherlynn Polo, RN  ?Consulted on the plan of care for this client.  I agree with the documented note and actions taken to provide care for this client.  Hazle Coca, CNM ? ?

## 2022-03-08 LAB — BETA HCG QUANT (REF LAB): hCG Quant: 4 m[IU]/mL

## 2022-03-10 ENCOUNTER — Telehealth: Payer: Self-pay | Admitting: Family Medicine

## 2022-03-10 NOTE — Telephone Encounter (Signed)
Return call by patient.  Explained to patient her Beta Hcg results are back, but the provider has not reviewed nor signed them,  Her provider is out of the office today and will be back tomorrow.  She can expect a call tomorrow.  Dahlia Bailiff, RN ? ?

## 2022-03-10 NOTE — Telephone Encounter (Signed)
Return call to patient regarding her Beta Hcg results.  Results have not been reviewed by the provider at this time.  Left message for patient to return my call at [phone].  Language Line used today.  Luis / [phone].  Hart Carwin, RN ? ?

## 2022-03-10 NOTE — Telephone Encounter (Signed)
Pt called regarding her results from Friday. Please call her back. Thanks ?

## 2022-03-11 ENCOUNTER — Telehealth: Payer: Self-pay | Admitting: Family Medicine

## 2022-03-11 NOTE — Telephone Encounter (Signed)
Pt called yesterday in regards to her Beta results and was told by the nurse that the provider will give her a call today. She is awaiting the call. Thanks ?

## 2022-03-11 NOTE — Telephone Encounter (Signed)
TC to patient to inform of Beta Hcg level =4 and no more follow-up needed, per provider notes. Patient states understanding.Burt Knack, RN  ?

## 2022-07-02 ENCOUNTER — Ambulatory Visit: Payer: Self-pay

## 2022-08-14 ENCOUNTER — Ambulatory Visit (LOCAL_COMMUNITY_HEALTH_CENTER): Payer: Self-pay

## 2022-08-14 ENCOUNTER — Encounter: Payer: Self-pay | Admitting: Emergency Medicine

## 2022-08-14 ENCOUNTER — Other Ambulatory Visit: Payer: Self-pay

## 2022-08-14 ENCOUNTER — Emergency Department
Admission: EM | Admit: 2022-08-14 | Discharge: 2022-08-14 | Disposition: A | Discharge status: Home / Self Care | Payer: Self-pay | Attending: Emergency Medicine | Admitting: Emergency Medicine

## 2022-08-14 ENCOUNTER — Emergency Department: Payer: Self-pay

## 2022-08-14 VITALS — BP 113/74 | Ht 59.0 in | Wt 141.5 lb

## 2022-08-14 DIAGNOSIS — O4690 Antepartum hemorrhage, unspecified, unspecified trimester: Secondary | ICD-10-CM

## 2022-08-14 DIAGNOSIS — Z3201 Encounter for pregnancy test, result positive: Secondary | ICD-10-CM

## 2022-08-14 DIAGNOSIS — Z3A01 Less than 8 weeks gestation of pregnancy: Secondary | ICD-10-CM | POA: Insufficient documentation

## 2022-08-14 DIAGNOSIS — O0289 Other abnormal products of conception: Secondary | ICD-10-CM | POA: Insufficient documentation

## 2022-08-14 DIAGNOSIS — O418X10 Other specified disorders of amniotic fluid and membranes, first trimester, not applicable or unspecified: Secondary | ICD-10-CM

## 2022-08-14 DIAGNOSIS — O4691 Antepartum hemorrhage, unspecified, first trimester: Secondary | ICD-10-CM | POA: Insufficient documentation

## 2022-08-14 DIAGNOSIS — R1030 Lower abdominal pain, unspecified: Secondary | ICD-10-CM | POA: Insufficient documentation

## 2022-08-14 LAB — URINALYSIS, ROUTINE W REFLEX MICROSCOPIC
Bacteria, UA: NONE SEEN
Bilirubin Urine: NEGATIVE
Glucose, UA: NEGATIVE mg/dL
Ketones, ur: NEGATIVE mg/dL
Nitrite: NEGATIVE
Protein, ur: NEGATIVE mg/dL
Specific Gravity, Urine: <1.005 — ABNORMAL LOW (ref 1.005–1.030)
pH: 6 (ref 5.0–8.0)

## 2022-08-14 LAB — CBC WITH DIFFERENTIAL/PLATELET
Abs Immature Granulocytes: 0.08 10*3/uL — ABNORMAL HIGH (ref 0.00–0.07)
Basophils Absolute: 0 10*3/uL (ref 0.0–0.1)
Basophils Relative: 0 %
Eosinophils Absolute: 0.2 10*3/uL (ref 0.0–0.5)
Eosinophils Relative: 2 %
HCT: 38.9 % (ref 36.0–46.0)
Hemoglobin: 13.2 g/dL (ref 12.0–15.0)
Immature Granulocytes: 1 %
Lymphocytes Relative: 21 %
Lymphs Abs: 3 10*3/uL (ref 0.7–4.0)
MCH: 29.3 pg (ref 26.0–34.0)
MCHC: 33.9 g/dL (ref 30.0–36.0)
MCV: 86.4 fL (ref 80.0–100.0)
Monocytes Absolute: 0.8 10*3/uL (ref 0.1–1.0)
Monocytes Relative: 6 %
Neutro Abs: 10.2 10*3/uL — ABNORMAL HIGH (ref 1.7–7.7)
Neutrophils Relative %: 70 %
Platelets: 385 10*3/uL (ref 150–400)
RBC: 4.5 MIL/uL (ref 3.87–5.11)
RDW: 12.4 % (ref 11.5–15.5)
WBC: 14.3 10*3/uL — ABNORMAL HIGH (ref 4.0–10.5)
nRBC: 0 % (ref 0.0–0.2)

## 2022-08-14 LAB — PREGNANCY, URINE: Preg Test, Ur: POSITIVE — AB

## 2022-08-14 LAB — COMPREHENSIVE METABOLIC PANEL
ALT: 40 U/L (ref 0–44)
AST: 28 U/L (ref 15–41)
Albumin: 4.4 g/dL (ref 3.5–5.0)
Alkaline Phosphatase: 96 U/L (ref 38–126)
Anion gap: 9 (ref 5–15)
BUN: 11 mg/dL (ref 6–20)
CO2: 23 mmol/L (ref 22–32)
Calcium: 9.5 mg/dL (ref 8.9–10.3)
Chloride: 102 mmol/L (ref 98–111)
Creatinine, Ser: 0.6 mg/dL (ref 0.44–1.00)
GFR, Estimated: >60 mL/min (ref >60)
Glucose, Bld: 91 mg/dL (ref 70–99)
Potassium: 3.5 mmol/L (ref 3.5–5.1)
Sodium: 134 mmol/L — ABNORMAL LOW (ref 135–145)
Total Bilirubin: 0.5 mg/dL (ref 0.3–1.2)
Total Protein: 7.8 g/dL (ref 6.5–8.1)

## 2022-08-14 LAB — HCG, QUANTITATIVE, PREGNANCY: hCG, Beta Chain, Quant, S: 90271 m[IU]/mL — ABNORMAL HIGH (ref <5)

## 2022-08-14 LAB — POC URINE PREG, ED: Preg Test, Ur: POSITIVE — AB

## 2022-08-14 MED ORDER — PRENATAL VITAMINS 28-0.8 MG PO TABS: 28.0000 mg | ORAL_TABLET | Freq: Every day | ORAL | 0 refills | Status: AC

## 2022-08-14 NOTE — ED Triage Notes (Signed)
Patient ambulatory to triage with steady gait, without difficulty or distress noted; pt reports G4P3, [employer] 03/12/23, [redacted]wks pregnant, pt at [facility], pt with vag bleeding and cramping

## 2022-08-14 NOTE — ED Provider Notes (Signed)
[facility] Provider Note   Event Date/Time   First MD Initiated Contact with Patient 08/14/22 2039     (approximate) History  Vaginal Bleeding  HPI [name] is a 26 y.o. female with a stated past medical history of 10-week pregnancy who presents for cramping lower abdominal pain that is suprapubic and nonradiating.  Patient describes this pain as a 4/10 that is fairly constant and has been present over the last 3 days.  Patient does endorse a small amount of spotting today as well. ROS: Patient currently denies any vision changes, tinnitus, difficulty speaking, facial droop, sore throat, chest pain, shortness of breath, nausea/vomiting/diarrhea, dysuria, or weakness/numbness/paresthesias in any extremity   Physical Exam  Triage Vital Signs: ED Triage Vitals [08/14/22 1943]  Enc Vitals Group     BP 110/75     Pulse Rate 83     Resp 16     Temp 98.5 F (36.9 C)     Temp Source Oral     SpO2 99 %     Weight 141 lb 8.6 oz (64.2 kg)     Height 4\' 11"  (1.499 m)     Head Circumference      Peak Flow      Pain Score 4     Pain Loc      Pain Edu?      Excl. in [place]?    Most recent vital signs: Vitals:   08/14/22 1943 08/14/22 2237  BP: 110/75 112/78  Pulse: 83 84  Resp: 16 16  Temp: 98.5 F (36.9 C) 98.6 F (37 C)  SpO2: 99% 98%   General: Awake, oriented x4. CV:  Good peripheral perfusion.  Resp:  Normal effort.  Abd:  No distention.  Nontender Other:  Young adult Hispanic female laying in bed in no acute distress ED Results / Procedures / Treatments  Labs (all labs ordered are listed, but only abnormal results are displayed) Labs Reviewed  CBC WITH DIFFERENTIAL/PLATELET - Abnormal; Notable for the following components:      Result Value   WBC 14.3 (*)    Neutro Abs 10.2 (*)    Abs Immature Granulocytes 0.08 (*)    All other components within normal limits  COMPREHENSIVE METABOLIC PANEL - Abnormal; Notable for the  following components:   Sodium 134 (*)    All other components within normal limits  URINALYSIS, ROUTINE W REFLEX MICROSCOPIC - Abnormal; Notable for the following components:   Color, Urine YELLOW (*)    APPearance CLEAR (*)    Specific Gravity, Urine <1.005 (*)    Hgb urine dipstick TRACE (*)    Leukocytes,Ua SMALL (*)    All other components within normal limits  HCG, QUANTITATIVE, PREGNANCY - Abnormal; Notable for the following components:   hCG, Beta Chain, Quant, S 90,271 (*)    All other components within normal limits  POC URINE PREG, ED - Abnormal; Notable for the following components:   Preg Test, Ur POSITIVE (*)    All other components within normal limits  RADIOLOGY ED MD interpretation: OB ultrasound interpreted by me and shows single live intrauterine gestation at 6 weeks and 4 days by crown-rump length as well as a small subchorionic hemorrhage -Agree with radiology assessment Official radiology report(s): US OB LESS THAN 14 WEEKS WITH OB TRANSVAGINAL  Result Date: 08/14/2022 CLINICAL DATA:  Vaginal bleeding with positive pregnancy test EXAM: OBSTETRIC <14 WK [place] AND TRANSVAGINAL OB US TECHNIQUE: Both transabdominal and transvaginal ultrasound examinations  were performed for complete evaluation of the gestation as well as the maternal uterus, adnexal regions, and pelvic cul-de-sac. Transvaginal technique was performed to assess early pregnancy. COMPARISON:  None Available. FINDINGS: Intrauterine gestational sac: Present Yolk sac:  Present Embryo:  Present Cardiac Activity: Present Heart Rate: 132 bpm CRL:  7.1 mm   6 w   4 d                  [place] EDC: 04/05/2023 Subchorionic hemorrhage: Small subchorionic hemorrhage is noted. This measures up to 8 mm. Maternal uterus/adnexae: Corpus luteum cyst is noted on the right. Free pelvic fluid is noted. IMPRESSION: Single live intrauterine gestation at 6 weeks 4 days. Small subchorionic hemorrhage is noted. Electronically Signed   By: Inez Catalina M.D.   On: 08/14/2022 21:25   PROCEDURES: Critical Care performed: No .1-3 Lead EKG Interpretation  Performed by: Naaman Plummer, MD Authorized by: Naaman Plummer, MD     Interpretation: normal     ECG rate:  83   ECG rate assessment: normal     Rhythm: sinus rhythm     Ectopy: none     Conduction: normal    MEDICATIONS ORDERED IN ED: Medications - No data to display IMPRESSION / MDM / [place] / ED COURSE  I reviewed the triage vital signs and the nursing notes.                             Differential diagnosis includes, but is not limited to, threatened miscarriage, placenta previa, subchorionic hemorrhage The patient is on the cardiac monitor to evaluate for evidence of arrhythmia and/or significant heart rate changes. Patient's presentation is most consistent with acute presentation with potential threat to life or bodily function. Workup: CBC, BMP, UA, bHCG, Type&Screen, 1st Trimester Ultrasound  Based on History, Exam, and ED Workup patients presentation not consistent with ectopic pregnancy, molar pregnancy, life-threatening coagulopathy, trauma, serious bacterial infection, central process or other emergency.  There is evidence of a subchorionic hemorrhage that is relatively small but may likely be the source of patient's pain.  Patient was informed of the increased risk of possible miscarriage and encouraged follow-up with OB/GYN for further evaluation and management.  Disposition: Will discharge home with return precautions and instruction for prompt OBGYN follow up.   FINAL CLINICAL IMPRESSION(S) / ED DIAGNOSES   Final diagnoses:  Subchorionic hemorrhage of placenta in first trimester, single or unspecified fetus  Vaginal bleeding in pregnancy   Rx / DC Orders   ED Discharge Orders     None      Note:  This document was prepared using Dragon voice recognition software and may include unintentional dictation errors.   Naaman Plummer,  MD 08/14/22 [phone]

## 2022-08-14 NOTE — Progress Notes (Signed)
UPT positive.  Pt unsure of pregnancy plans.  States she will think about it and call for prenatal appt. Pt states received prenatal care here at health dept for first and third pregnancies   Pregnancy packet given and reviewed.  Stressed prenatal care as soon as decision made.   The patient was dispensed prenatal vitamins today. I provided counseling today regarding the medication.   Tonny Branch, RN

## 2022-09-29 ENCOUNTER — Telehealth: Payer: Self-pay

## 2022-09-29 NOTE — Telephone Encounter (Signed)
[facility] as scheduled for [facility] IP on 09/29/2022. Call to client and left message requesting she reschedule appt and number to call provide. Rich Number, RN

## 2022-09-29 NOTE — Telephone Encounter (Signed)
Return call from client on clinic phone and [facility] IP appt scheduled for 10/09/2022 with arrival time of 1245. Rich Number, RN

## 2022-10-09 ENCOUNTER — Ambulatory Visit: Payer: Medicaid Other | Admitting: Physician Assistant

## 2022-10-09 ENCOUNTER — Encounter: Payer: Self-pay | Admitting: Physician Assistant

## 2022-10-09 VITALS — BP 94/63 | HR 89 | Temp 97.6°F | Ht 60.0 in | Wt 140.0 lb

## 2022-10-09 DIAGNOSIS — Z3482 Encounter for supervision of other normal pregnancy, second trimester: Secondary | ICD-10-CM | POA: Diagnosis not present

## 2022-10-09 DIAGNOSIS — A749 Chlamydial infection, unspecified: Secondary | ICD-10-CM

## 2022-10-09 DIAGNOSIS — Z3480 Encounter for supervision of other normal pregnancy, unspecified trimester: Secondary | ICD-10-CM | POA: Insufficient documentation

## 2022-10-09 LAB — HEMOGLOBIN, FINGERSTICK: Hemoglobin: 13.1 g/dL (ref 11.1–15.9)

## 2022-10-09 NOTE — Progress Notes (Signed)
Per Shawn in [facility] lab, all ordered labwork obtained except for Quantiferon TB Gold as vein collapsed. Client declines another stick today and states prefers to have drawn at next appt. Above noted on pink sticky. Jossie Ng, RN

## 2022-10-09 NOTE — Progress Notes (Signed)
Hgb = 13.1 Reviewed - no treatment indicated.   Schedule Anat [place] during clinic visit for [facility]. Patient aware to arrive at 12:30 on 11/13/22 at the medical mall entrance.    Earlyne Iba, RN

## 2022-10-09 NOTE — Progress Notes (Signed)
[facility]  Maternal Health Clinic   INITIAL PRENATAL VISIT NOTE  Subjective:  [name] is a 26 y.o. [phone] at [redacted]w[redacted]d being seen today to start prenatal care at the [facility].  She is currently monitored for the following issues for this low-risk pregnancy and has Gall stones; Overweight BMI=27.3; Miscarriage; and Supervision of other normal pregnancy, antepartum on their problem list.  Patient reports  general mouth pain for several weeks .  Contractions: Not present. Vag. Bleeding: None.  Movement: Absent. Denies leaking of fluid.   Indications for ASA therapy (per uptodate) One of the following: Previous pregnancy with preeclampsia, especially early onset and with an adverse outcome No Multifetal gestation No Chronic hypertension No Type 1 or 2 diabetes mellitus No Chronic kidney disease No Autoimmune disease (antiphospholipid syndrome, systemic lupus erythematosus) No  Two or more of the following: Nulliparity No Obesity (body mass index >30 kg/m2) No Family history of preeclampsia in mother or sister No Age ?35 years No Sociodemographic characteristics (African American race, low socioeconomic level) Yes Personal risk factors (eg, previous pregnancy with low birth weight or small for gestational age infant, previous adverse pregnancy outcome [eg, stillbirth], interval >10 years between pregnancies) No   The following portions of the patient's history were reviewed and updated as appropriate: allergies, current medications, past family history, past medical history, past social history, past surgical history and problem list. Problem list updated.  Objective:   Vitals:   10/09/22 1319 10/09/22 1426  BP: 94/63   Pulse: 89   Temp: 97.6 F (36.4 C)   Weight: 140 lb (63.5 kg)   Height:  5' (1.524 m)    Fetal Status: Fetal Heart Rate (bpm): 153 Fundal Height: 15 cm Movement: Absent  Presentation:  Undeterminable  Physical Exam Vitals and nursing note reviewed.  Constitutional:      General: She is not in acute distress.    Appearance: Normal appearance. She is well-developed.  HENT:     Head: Normocephalic and atraumatic.     Right Ear: External ear normal.     Left Ear: External ear normal.     Nose: Nose normal. No congestion or rhinorrhea.     Mouth/Throat:     Lips: Pink.     Mouth: Mucous membranes are moist.     Dentition: Normal dentition. No dental caries.     Pharynx: Oropharynx is clear. Uvula midline.     Comments: Dentition: few dental repairs noted Eyes:     General: No scleral icterus.    Conjunctiva/sclera: Conjunctivae normal.  Neck:     Thyroid: No thyroid mass or thyromegaly.  Cardiovascular:     Rate and Rhythm: Normal rate and regular rhythm.     Pulses: Normal pulses.     Heart sounds: Normal heart sounds.     Comments: Extremities are warm and well perfused Pulmonary:     Effort: Pulmonary effort is normal.     Breath sounds: Normal breath sounds.  Chest:     Chest wall: No mass.  Breasts:    Tanner Score is 5.     Breasts are symmetrical.     Right: Normal. No mass, nipple discharge or skin change.     Left: Normal. No mass, nipple discharge or skin change.  Abdominal:     General: Abdomen is flat. A surgical scar is present.     Palpations: Abdomen is soft.     Tenderness: There is no abdominal tenderness.  Comments: Gravid  Several small RUQ scars, umbilical scar.  Genitourinary:    General: Normal vulva.     Exam position: Lithotomy position.     Pubic Area: No rash.      Labia:        Right: No rash.        Left: No rash.      Vagina: Normal. No vaginal discharge.     Cervix: No cervical motion tenderness or friability.     Uterus: Normal. Enlarged (Gravid 14-16 wk size). Not tender.      Adnexa: Right adnexa normal and left adnexa normal.     Rectum: Normal. No external hemorrhoid.  Musculoskeletal:     Right lower leg: No  edema.     Left lower leg: No edema.  Lymphadenopathy:     Cervical: No cervical adenopathy.     Upper Body:     Right upper body: No axillary adenopathy.     Left upper body: No axillary adenopathy.  Skin:    General: Skin is warm.     Capillary Refill: Capillary refill takes less than 2 seconds.  Neurological:     Mental Status: She is alert.    Assessment and Plan:  Pregnancy: [id number] at [redacted]w[redacted]d  1. Supervision of other normal pregnancy, antepartum Routine prenatal labs today. PHQ-9 score = 2. Pt declines genetic counseling referral. Please give dentist list, recommend routine dental care during pregnancy, and dental eval soon in light of mouth pain. Schedule low risk fetal anat scan at 19-20 wk. Recommend flu vaccine - declines today. - Pregnancy, Initial Screen - Varicella zoster antibody, IgG - Lead, blood (adult age 58 yrs or greater) - Hemoglobinopathy evaluation -121690 - QuantiFERON-TB Gold Plus - MaterniT21 PLUS Core - Hemoglobin, fingerstick - US OB Comp + 14 Wk; Future  Discussed overview of care and coordination with inpatient delivery practices including [facility], [facility] and [facility].    Preterm labor symptoms and general obstetric precautions including but not limited to vaginal bleeding, contractions, leaking of fluid and fetal movement were reviewed in detail with the patient.  Please refer to After Visit Summary for other counseling recommendations.   Return in about 4 weeks (around 11/06/2022) for Routine prenatal care.  Future Appointments  Date Time Provider Department Center  11/04/2022 11:00 AM [facility] PROVIDER [facility] None  11/13/2022  1:00 PM [facility]    Landry Dyke, PA-C

## 2022-10-14 LAB — MATERNIT 21 PLUS CORE, BLOOD
Fetal Fraction: 4
Result (T21): NEGATIVE
Trisomy 13 (Patau syndrome): NEGATIVE
Trisomy 18 (Edwards syndrome): NEGATIVE
Trisomy 21 (Down syndrome): NEGATIVE

## 2022-10-14 LAB — PREGNANCY, INITIAL SCREEN
Antibody Screen: NEGATIVE
Basophils Absolute: 0 10*3/uL (ref 0.0–0.2)
Basos: 0 %
Bilirubin, UA: NEGATIVE
Chlamydia trachomatis, NAA: POSITIVE — AB
EOS (ABSOLUTE): 0.3 10*3/uL (ref 0.0–0.4)
Eos: 3 %
Glucose, UA: NEGATIVE
HCV Ab: NONREACTIVE
HIV Screen 4th Generation wRfx: NONREACTIVE
Hematocrit: 38.1 % (ref 34.0–46.6)
Hemoglobin: 12.8 g/dL (ref 11.1–15.9)
Hepatitis B Surface Ag: NEGATIVE
Immature Grans (Abs): 0.1 10*3/uL (ref 0.0–0.1)
Immature Granulocytes: 1 %
Ketones, UA: NEGATIVE
Lymphocytes Absolute: 2.1 10*3/uL (ref 0.7–3.1)
Lymphs: 19 %
MCH: 29.8 pg (ref 26.6–33.0)
MCHC: 33.6 g/dL (ref 31.5–35.7)
MCV: 89 fL (ref 79–97)
Monocytes Absolute: 0.6 10*3/uL (ref 0.1–0.9)
Monocytes: 5 %
Neisseria Gonorrhoeae by PCR: NEGATIVE
Neutrophils Absolute: 7.7 10*3/uL — ABNORMAL HIGH (ref 1.4–7.0)
Neutrophils: 72 %
Nitrite, UA: NEGATIVE
Platelets: 413 10*3/uL (ref 150–450)
Protein,UA: NEGATIVE
RBC, UA: NEGATIVE
RBC: 4.29 x10E6/uL (ref 3.77–5.28)
RDW: 13.1 % (ref 11.7–15.4)
RPR Ser Ql: NONREACTIVE
Rh Factor: POSITIVE
Rubella Antibodies, IGG: 1.73 index (ref >0.99)
Specific Gravity, UA: 1.01 (ref 1.005–1.030)
Urobilinogen, Ur: 0.2 mg/dL (ref 0.2–1.0)
WBC: 10.8 10*3/uL (ref 3.4–10.8)
pH, UA: 6 (ref 5.0–7.5)

## 2022-10-14 LAB — HGB FRACTIONATION CASCADE
Hgb A2: 2.7 % (ref 1.8–3.2)
Hgb A: 97.3 % (ref 96.4–98.8)
Hgb F: 0 % (ref 0.0–2.0)
Hgb S: 0 %

## 2022-10-14 LAB — VARICELLA ZOSTER ANTIBODY, IGG: Varicella zoster IgG: 544 index (ref >165)

## 2022-10-14 LAB — MICROSCOPIC EXAMINATION
Casts: NONE SEEN /lpf
RBC, Urine: NONE SEEN /hpf (ref 0–2)

## 2022-10-14 LAB — LEAD, BLOOD (ADULT >= 16 YRS): Lead-Whole Blood: <1 ug/dL (ref 0.0–3.4)

## 2022-10-14 LAB — URINE CULTURE, OB REFLEX

## 2022-10-14 LAB — HCV INTERPRETATION

## 2022-10-15 ENCOUNTER — Telehealth: Payer: Self-pay

## 2022-10-15 DIAGNOSIS — A749 Chlamydial infection, unspecified: Secondary | ICD-10-CM | POA: Insufficient documentation

## 2022-10-15 NOTE — Telephone Encounter (Signed)
Call to patient to let her know of positive chlamydia results. Patient states she can come in today this morning in clinic for treatment.    Earlyne Iba, RN

## 2022-10-15 NOTE — Telephone Encounter (Signed)
Did not keep appt 10/15/22 for chlamydia treatment. Call to client to reschedule appt and client requests appt next week. Appt scheduled for 10/21/2022. Jossie Ng, RN

## 2022-10-21 ENCOUNTER — Other Ambulatory Visit: Payer: Medicaid Other

## 2022-10-21 DIAGNOSIS — A749 Chlamydial infection, unspecified: Secondary | ICD-10-CM

## 2022-10-21 MED ORDER — AZITHROMYCIN 500 MG PO TABS
1000.0000 mg | ORAL_TABLET | Freq: Once | ORAL | Status: AC
  Administered 2022-10-21: 1000 mg via ORAL

## 2022-10-21 NOTE — Progress Notes (Addendum)
Pt in nurse clinic for treatment of chlamydia.  Pt is seen in [facility] here for prenatal care and positive result on pregnancy initial screen.  See STD results/treatment flowsheet.    The patient was dispensed Azithromycin 500 mg 2 tablets (one gram total) today per standing order by Dr. Karyl Kinnier. I provided counseling today regarding the medication. We discussed the medication, the side effects and when to call clinic. Patient given the opportunity to ask questions. Questions answered.   Pt took medication with water and stated she had recently had a meal.  Instructed pt if she vomits within 2 hours of taking medication to let us know as she would need to be retreated; pt agreed.  MHC RV appt 11/04/22 and pt aware of appt date.     Cherlynn Polo, RN

## 2022-11-04 ENCOUNTER — Ambulatory Visit: Payer: Medicaid Other | Admitting: Nurse Practitioner

## 2022-11-04 VITALS — BP 92/60 | HR 75 | Temp 98.6°F | Wt 140.4 lb

## 2022-11-04 DIAGNOSIS — A749 Chlamydial infection, unspecified: Secondary | ICD-10-CM

## 2022-11-04 DIAGNOSIS — Z3482 Encounter for supervision of other normal pregnancy, second trimester: Secondary | ICD-10-CM | POA: Diagnosis not present

## 2022-11-04 DIAGNOSIS — Z3480 Encounter for supervision of other normal pregnancy, unspecified trimester: Secondary | ICD-10-CM

## 2022-11-04 NOTE — Progress Notes (Unsigned)
Treated on the 28th for chlamydia. Patient aware of upcoming U/S. Bthiele RN

## 2022-11-04 NOTE — Progress Notes (Unsigned)
[facility]  PRENATAL VISIT NOTE  Subjective:  [name] is a 26 y.o. [phone] at [redacted]w[redacted]d being seen today for ongoing prenatal care.  She is currently monitored for the following issues for this low-risk pregnancy and has Gall stones; Overweight BMI=27.3; Miscarriage; Supervision of other normal pregnancy, antepartum; and Chlamydia infection on their problem list.  Patient reports no complaints.  Contractions: Not present. Vag. Bleeding: None.  Movement: Present. Denies leaking of fluid/ROM.   The following portions of the patient's history were reviewed and updated as appropriate: allergies, current medications, past family history, past medical history, past social history, past surgical history and problem list. Problem list updated.  Objective:   Vitals:   11/04/22 2345  BP: 92/60  Pulse: 75  Temp: 98.6 F (37 C)  Weight: 140 lb 6.4 oz (63.7 kg)    Fetal Status: Fetal Heart Rate (bpm): 140 Fundal Height: 18 cm Movement: Present     General:  Alert, oriented and cooperative. Patient is in no acute distress.  Skin: Skin is warm and dry. No rash noted.   Cardiovascular: Normal heart rate noted  Respiratory: Normal respiratory effort, no problems with respiration noted  Abdomen: Soft, gravid, appropriate for gestational age.  Pain/Pressure: Absent     Pelvic: Cervical exam deferred        Extremities: Normal range of motion.  Edema: None  Mental Status: Normal mood and affect. Normal behavior. Normal judgment and thought content.   Assessment and Plan:  Pregnancy: L3Y1017 at [redacted]w[redacted]d  1. Supervision of other normal pregnancy, antepartum -26 year old female in clinic for prenatal care. -ROS reviewed, no complaints reported.  -Patient reports taking PNV daily.  -QFT drawn today. -10 lb 6.4 oz (4.717 kg) -[place] scheduled for 11/13/22.   - QuantiFERON-TB Gold Plus - Chlamydia/GC NAA, Confirmation  2. Chlamydia  infection -TOC today for Chlamydia today. Patient states she completed medication and partner was also treated.   - Chlamydia/GC NAA, Confirmation   Term labor symptoms and general obstetric precautions including but not limited to vaginal bleeding, contractions, leaking of fluid and fetal movement were reviewed in detail with the patient. Please refer to After Visit Summary for other counseling recommendations.   Return in about 4 weeks (around 12/02/2022) for Routine prenatal care visit.  Future Appointments  Date Time Provider Department Center  11/13/2022  1:00 PM [facility]    Glenna Fellows, FNP

## 2022-11-08 LAB — QUANTIFERON-TB GOLD PLUS
QuantiFERON Mitogen Value: 4.05 IU/mL
QuantiFERON Nil Value: 0.01 IU/mL
QuantiFERON TB1 Ag Value: 0.05 IU/mL
QuantiFERON TB2 Ag Value: 0.04 IU/mL
QuantiFERON-TB Gold Plus: NEGATIVE

## 2022-11-08 LAB — CHLAMYDIA/GC NAA, CONFIRMATION
Chlamydia trachomatis, NAA: NEGATIVE
Neisseria gonorrhoeae, NAA: NEGATIVE

## 2022-11-13 ENCOUNTER — Ambulatory Visit
Admission: RE | Admit: 2022-11-13 | Discharge: 2022-11-13 | Disposition: A | Discharge status: Home / Self Care | Payer: Self-pay | Source: Ambulatory Visit | Attending: Physician Assistant | Admitting: Physician Assistant

## 2022-11-13 DIAGNOSIS — Z3480 Encounter for supervision of other normal pregnancy, unspecified trimester: Secondary | ICD-10-CM | POA: Insufficient documentation

## 2022-11-13 DIAGNOSIS — Z3A19 19 weeks gestation of pregnancy: Secondary | ICD-10-CM | POA: Insufficient documentation

## 2022-11-13 DIAGNOSIS — O321XX0 Maternal care for breech presentation, not applicable or unspecified: Secondary | ICD-10-CM | POA: Insufficient documentation

## 2022-12-03 ENCOUNTER — Ambulatory Visit: Payer: Self-pay

## 2022-12-03 ENCOUNTER — Telehealth: Payer: Self-pay

## 2022-12-03 NOTE — Telephone Encounter (Signed)
Call to patient due to missed [place] RV on 12/02/22. Patient states her daughters go to school and they had a 2 hour delay so she could not make it to appointment. Appointment rescheduled for tomorrow 12/04/22 for 0820 arrival time.   Al Decant, RN

## 2022-12-04 ENCOUNTER — Ambulatory Visit: Payer: Self-pay | Admitting: Family Medicine

## 2022-12-04 VITALS — BP 105/63 | HR 91 | Temp 97.7°F | Wt 143.2 lb

## 2022-12-04 DIAGNOSIS — O09899 Supervision of other high risk pregnancies, unspecified trimester: Secondary | ICD-10-CM

## 2022-12-04 DIAGNOSIS — Z3482 Encounter for supervision of other normal pregnancy, second trimester: Secondary | ICD-10-CM

## 2022-12-04 DIAGNOSIS — Z3480 Encounter for supervision of other normal pregnancy, unspecified trimester: Secondary | ICD-10-CM

## 2022-12-04 DIAGNOSIS — O09892 Supervision of other high risk pregnancies, second trimester: Secondary | ICD-10-CM

## 2022-12-04 MED ORDER — PRENATAL MULTIVITAMIN CH: 1.0000 | ORAL_TABLET | Freq: Every day | ORAL | 0 refills | Status: DC

## 2022-12-04 NOTE — Progress Notes (Signed)
Patient here for MH RV at [redacted]w[redacted]d.   PNV dispensed to patient per request.   Patient consented to AFP spina bifida testing. Lab order placed and patient aware to go to lab.   Al Decant, RN

## 2022-12-04 NOTE — Progress Notes (Signed)
[place] Department Maternal Health Clinic  PRENATAL VISIT NOTE  Subjective:  [name] is a 27 y.o. [phone] at [redacted]w[redacted]d being seen today for ongoing prenatal care.  She is currently monitored for the following issues for this low-risk pregnancy and has Gall stones; Overweight BMI=27.3; Miscarriage; Supervision of other normal pregnancy, antepartum; and Chlamydia infection on their problem list.  Patient reports no complaints.  Contractions: Not present. Vag. Bleeding: None.  Movement: Present. Denies leaking of fluid/ROM.   The following portions of the patient's history were reviewed and updated as appropriate: allergies, current medications, past family history, past medical history, past social history, past surgical history and problem list. Problem list updated.  Objective:   Vitals:   12/04/22 0842  BP: 105/63  Pulse: 91  Temp: 97.7 F (36.5 C)  Weight: 143 lb 3.2 oz (65 kg)    Fetal Status: Fetal Heart Rate (bpm): 148 Fundal Height: 21 cm Movement: Present  Presentation: Undeterminable  General:  Alert, oriented and cooperative. Patient is in no acute distress.  Skin: Skin is warm and dry. No rash noted.   Cardiovascular: Normal heart rate noted  Respiratory: Normal respiratory effort, no problems with respiration noted  Abdomen: Soft, gravid, appropriate for gestational age.  Pain/Pressure: Absent     Pelvic: Cervical exam deferred        Extremities: Normal range of motion.  Edema: None  Mental Status: Normal mood and affect. Normal behavior. Normal judgment and thought content.   Assessment and Plan:  Pregnancy: G5P3013 at [redacted]w[redacted]d  1. Supervision of other normal pregnancy, antepartum -Pt reports taking PNV daily -AFP drawn today -13 lb 3.2 oz (5.987 kg) -U/S from 11/13/22 reviewed: Placenta posterior, no previa, amniotic fluid normal, anatomy normal, 3 VC  - Prenatal Vit-Fe Fumarate-FA (PRENATAL MULTIVITAMIN) TABS tablet; Take 1 tablet  by mouth daily at 12 noon.  Dispense: 100 tablet; Refill: 0 - AFP, Serum, Open Spina Bifida  2. History of maternal chlamydia infection, currently pregnant -positive chlamydia at first prenatal- treated and TOC done and negative -test for reinfection due ~01/21/23  Preterm labor symptoms and general obstetric precautions including but not limited to vaginal bleeding, contractions, leaking of fluid and fetal movement were reviewed in detail with the patient. Please refer to After Visit Summary for other counseling recommendations.  Return in about 4 weeks (around 01/01/2023) for Routine Prenatal Care.  No future appointments.  Sharlet Salina, [place]

## 2022-12-05 NOTE — Addendum Note (Signed)
Addended by: Cletis Media on: 12/05/2022 03:21 PM   Modules accepted: Orders

## 2022-12-06 LAB — AFP, SERUM, OPEN SPINA BIFIDA
AFP MoM: 0.71
AFP Value: 57.2 ng/mL
Gest. Age on Collection Date: 22.4 weeks
Maternal Age At EDD: 25.7 yr
OSBR Risk 1 IN: 10000
Test Results:: NEGATIVE
Weight: 143 [lb_av]

## 2023-01-01 ENCOUNTER — Telehealth: Payer: Self-pay

## 2023-01-01 ENCOUNTER — Ambulatory Visit: Payer: Self-pay

## 2023-01-01 NOTE — Telephone Encounter (Signed)
TC to patient to reschedule missed MH RV. Patient phone "not accepting calls". TC to patient emergency contact and asked her to have [place] call us here at the health department. Patient mother states she has the number to call and will tell her daughter to call.Jenetta Downer, RN

## 2023-01-05 NOTE — Telephone Encounter (Signed)
Call to patient to reschedule missed MH RV on 01/01/23.   Call went straight to VM and LVM message for patient to reschedule appointment by calling number [phone].   Al Decant, RN

## 2023-01-06 NOTE — Telephone Encounter (Signed)
Call to patient to reschedule missed MH RV on 01/01/23.    Call patient and no answer. LVM message for patient to reschedule appointment by calling number [phone].   Call to emergency contact, mother Santiago Glad. She answered phone and she states she will contact patient and give her phone number to call to reschedule appointment.    Al Decant, RN

## 2023-01-06 NOTE — Telephone Encounter (Signed)
Patient returned phone call to clinic. She states she would like an appointment in the morning around 10am. Soonest available for that time is for 01/15/23 at 10:20 arrival time. Patient verbalized understanding of appointment time. Informed patient importance of keeping appointment and rescheduling as soon as possible.   Al Decant, RN

## 2023-01-15 ENCOUNTER — Ambulatory Visit: Payer: Self-pay

## 2023-01-15 ENCOUNTER — Telehealth: Payer: Self-pay

## 2023-01-15 NOTE — Telephone Encounter (Signed)
Call to client as [facility] in [facility] for RV today. Per client, she forgot about appt. Appt rescheduled for 01/22/2023 with arrival time of 1400. Rich Number, RN

## 2023-01-22 ENCOUNTER — Ambulatory Visit: Payer: Self-pay

## 2023-01-22 ENCOUNTER — Telehealth: Payer: Self-pay

## 2023-01-22 NOTE — Telephone Encounter (Signed)
Call to patient as she missed [place] RV appointment on 01/22/23. Called number twice and phone is disconnected.   Call to emergency contact, mother Santiago Glad. Per Santiago Glad patient knew she had to come in today and does not know why she did not come. She states she will notify patient to call [facility] appointment line to schedule appointment.   Al Decant, RN

## 2023-01-30 ENCOUNTER — Ambulatory Visit: Payer: Self-pay | Admitting: Family Medicine

## 2023-01-30 ENCOUNTER — Telehealth: Payer: Self-pay

## 2023-01-30 ENCOUNTER — Ambulatory Visit: Payer: Self-pay

## 2023-01-30 NOTE — Telephone Encounter (Signed)
[facility] for [place] RV 01/30/2023. Call to client with [facility] [employer] ID # [phone] and per recorded message, not able to receive calls. Call to mother (emergency contact) and requested assistance contacting client to call [facility] to schedule appt. Appt line number provided to mother. Rich Number, RN

## 2023-02-02 NOTE — Telephone Encounter (Signed)
Per Laqueta Jean, [facility] clerical intake, client scheduled appt for 02/06/2023 (only available appt this week). Rich Number, RN

## 2023-02-06 ENCOUNTER — Ambulatory Visit: Payer: Self-pay | Admitting: Family Medicine

## 2023-02-06 ENCOUNTER — Ambulatory Visit: Payer: Self-pay

## 2023-02-06 VITALS — BP 109/72 | HR 103 | Temp 97.7°F | Wt 147.8 lb

## 2023-02-06 DIAGNOSIS — Z91199 Patient's noncompliance with other medical treatment and regimen due to unspecified reason: Secondary | ICD-10-CM | POA: Insufficient documentation

## 2023-02-06 DIAGNOSIS — A749 Chlamydial infection, unspecified: Secondary | ICD-10-CM

## 2023-02-06 DIAGNOSIS — Z3480 Encounter for supervision of other normal pregnancy, unspecified trimester: Secondary | ICD-10-CM

## 2023-02-06 DIAGNOSIS — O99213 Obesity complicating pregnancy, third trimester: Secondary | ICD-10-CM

## 2023-02-06 DIAGNOSIS — O09893 Supervision of other high risk pregnancies, third trimester: Secondary | ICD-10-CM

## 2023-02-06 DIAGNOSIS — E663 Overweight: Secondary | ICD-10-CM

## 2023-02-06 DIAGNOSIS — Z23 Encounter for immunization: Secondary | ICD-10-CM

## 2023-02-06 DIAGNOSIS — Z3483 Encounter for supervision of other normal pregnancy, third trimester: Secondary | ICD-10-CM

## 2023-02-06 LAB — HEMOGLOBIN, FINGERSTICK: Hemoglobin: 13.1 g/dL (ref 11.1–15.9)

## 2023-02-06 LAB — WET PREP FOR TRICH, YEAST, CLUE
Trichomonas Exam: NEGATIVE
Yeast Exam: NEGATIVE

## 2023-02-06 NOTE — Progress Notes (Signed)
[place] Department M[facility] Health Clinic  PRENATAL VISIT NOTE  Subjective:  [name] is a 27 y.o. [phone] at [redacted]w[redacted]d being seen today for ongoing prenatal care.  She is currently monitored for the following issues for this low-risk pregnancy and has Gall stones; Overweight BMI=27.3; History of miscarriage, currently pregnant; Supervision of other normal pregnancy, antepartum; Chlamydia infection; and Missed prenatal care x ~10 weeks on their problem list.  Patient reports no complaints.  Contractions: Irregular. Vag. Bleeding: None.  Movement: Present. Complains of leaking of fluid/ROM that stopped 2 weeks ago.   The following portions of the patient's history were reviewed and updated as appropriate: allergies, current medications, past family history, past medical history, past social history, past surgical history and problem list. Problem list updated.  Objective:   Vitals:   02/06/23 1546  BP: 109/72  Pulse: (!) 103  Temp: 97.7 F (36.5 C)  Weight: 147 lb 12.8 oz (67 kg)    Fetal Status: Fetal Heart Rate (bpm): 157 Fundal Height: 28 cm Movement: Present     General:  Alert, oriented and cooperative. Patient is in no acute distress.  Skin: Skin is warm and dry. No rash noted.   Cardiovascular: Normal heart rate noted  Respiratory: Normal respiratory effort, no problems with respiration noted  Abdomen: Soft, gravid, appropriate for gestational age.  Pain/Pressure: Present     Pelvic: Cervical exam deferred        Extremities: Normal range of motion.  Edema: Trace  Mental Status: Normal mood and affect. Normal behavior. Normal judgment and thought content.   Assessment and Plan:  Pregnancy: [id number] at [redacted]w[redacted]d  1. Supervision of other normal pregnancy, antepartum -patient declined to be stuck twice today-will need to do HIV/RPR at RV -pt felt fluid come from her vagina about 2 weeks ago- states it was trickling out- none now, intermittent  contractions-testing today for ROM -negative pooling, negative nitrazine, negative ferning, white creamy leukorrhea- wet mount and GC/Chlamydia done -declined Tdap initially- agreed after counseling  - Glucose, 1 hour gestational - Hemoglobin, venipuncture - WET PREP FOR TRICH, YEAST, CLUE  2. Missed prenatal care x ~10 weeks -patient missed care x 10 weeks- will do some of 28 week labs today- finished glucola drink at 3:50p- so lab would no draw her HIV/RPR at 4:50pm. -do patients HIV/RPR at RV at 34 weeks  - [id number] 7+Oxycodone-Bund  3. Overweight BMI=27.3 17 lb 12.8 oz (8.074 kg) -exercise-walking 2-3x/ week for 2 hours -only gained 4# in the last 10 weeks -states she feels sleepy but is eating enough  4. Chlamydia infection -testing for reinfection today  - Chlamydia/GC NAA, Confirmation   Preterm labor symptoms and general obstetric precautions including but not limited to vaginal bleeding, contractions, leaking of fluid and fetal movement were reviewed in detail with the patient. Please refer to After Visit Summary for other counseling recommendations.  Return in about 2 weeks (around 02/20/2023) for Routine Prenatal Care.  No future appointments.   Sharlet Salina, [place]

## 2023-02-06 NOTE — Progress Notes (Signed)
Pt here for M[facility]V at [place]. 28 week labs to be done today, Tdap given to pt, tolerated well. Due to time, HIV and RPR were not drawn today, to be done next visit. Pt is scheduled to come in at 10:45am on 02/19/23, pt given appt card.  Wet prep reviewed; no treatment indicated.  Hgb:13.1, no treatment indicated.  Harland Dingwall, RN

## 2023-02-07 LAB — GLUCOSE, 1 HOUR GESTATIONAL: Gestational Diabetes Screen: 156 mg/dL — ABNORMAL HIGH (ref 70–139)

## 2023-02-09 DIAGNOSIS — O99810 Abnormal glucose complicating pregnancy: Secondary | ICD-10-CM | POA: Insufficient documentation

## 2023-02-10 ENCOUNTER — Other Ambulatory Visit: Payer: Self-pay

## 2023-02-10 DIAGNOSIS — R7309 Other abnormal glucose: Secondary | ICD-10-CM

## 2023-02-10 LAB — 789231 7+OXYCODONE-BUND
Amphetamines, Urine: NEGATIVE ng/mL
BENZODIAZ UR QL: NEGATIVE ng/mL
Barbiturate screen, urine: NEGATIVE ng/mL
Cannabinoid Quant, Ur: NEGATIVE ng/mL
Cocaine (Metab.): NEGATIVE ng/mL
OPIATE SCREEN URINE: NEGATIVE ng/mL
Oxycodone/Oxymorphone, Urine: NEGATIVE ng/mL
PCP Quant, Ur: NEGATIVE ng/mL

## 2023-02-10 LAB — CHLAMYDIA/GC NAA, CONFIRMATION
Chlamydia trachomatis, NAA: NEGATIVE
Neisseria gonorrhoeae, NAA: NEGATIVE

## 2023-02-10 NOTE — Progress Notes (Signed)
In nurse clinic for 3 hr GTT. On 02/06/2023, 1 hr GTT = 156. Reports npo since 9 pm last night. Instructions reviewed for test today. Advised to notify RN if nausea/vomiting. Next MHC appt 02/19/23, pt aware. Josie Saunders, RN

## 2023-02-11 ENCOUNTER — Telehealth: Payer: Self-pay

## 2023-02-11 ENCOUNTER — Other Ambulatory Visit: Payer: Self-pay | Admitting: Advanced Practice Midwife

## 2023-02-11 DIAGNOSIS — O99810 Abnormal glucose complicating pregnancy: Secondary | ICD-10-CM

## 2023-02-11 LAB — GESTATIONAL GLUCOSE TOLERANCE
Glucose, Fasting: 76 mg/dL (ref 70–94)
Glucose, GTT - 1 Hour: 146 mg/dL (ref 70–179)
Glucose, GTT - 2 Hour: 155 mg/dL — ABNORMAL HIGH (ref 70–154)
Glucose, GTT - 3 Hour: 104 mg/dL (ref 70–139)

## 2023-02-11 NOTE — Telephone Encounter (Signed)
Call to patient to let her know her 3 hr gtt results were equivocal. Explained to patient what that meant and for her to expect a call from one of the [facility] nutritionist to speak with her regarding diet counseling. Patient verbalized understanding and questions answered.   Al Decant, RN

## 2023-02-19 ENCOUNTER — Ambulatory Visit: Payer: Self-pay

## 2023-02-19 ENCOUNTER — Telehealth: Payer: Self-pay

## 2023-02-19 NOTE — Telephone Encounter (Signed)
TC to patient to reschedule missed MH RV today. LM for patient with number to call.Jenetta Downer, RN

## 2023-02-24 NOTE — Telephone Encounter (Signed)
Call to client to reschedule missed [facility] RV appt and per recorded message, not able to receive calls at this time. Call to emergency contact with [facility] [employer] ID # [phone] and per recorded message, number is not available. Rich Number, RN

## 2023-02-26 ENCOUNTER — Telehealth: Payer: Self-pay

## 2023-02-27 ENCOUNTER — Ambulatory Visit: Payer: Self-pay | Admitting: Advanced Practice Midwife

## 2023-02-27 VITALS — BP 101/69 | HR 90 | Temp 97.9°F | Wt 151.2 lb

## 2023-02-27 DIAGNOSIS — Z91199 Patient's noncompliance with other medical treatment and regimen due to unspecified reason: Secondary | ICD-10-CM

## 2023-02-27 DIAGNOSIS — O99810 Abnormal glucose complicating pregnancy: Secondary | ICD-10-CM

## 2023-02-27 DIAGNOSIS — Z3483 Encounter for supervision of other normal pregnancy, third trimester: Secondary | ICD-10-CM

## 2023-02-27 DIAGNOSIS — Z3480 Encounter for supervision of other normal pregnancy, unspecified trimester: Secondary | ICD-10-CM

## 2023-02-27 DIAGNOSIS — A749 Chlamydial infection, unspecified: Secondary | ICD-10-CM

## 2023-02-27 DIAGNOSIS — O09893 Supervision of other high risk pregnancies, third trimester: Secondary | ICD-10-CM

## 2023-02-27 NOTE — Progress Notes (Signed)
[facility] Department Maternal Health Clinic  PRENATAL VISIT NOTE  Subjective:  [name] is a 27 y.o. [phone] at [redacted]w[redacted]d being seen today for ongoing prenatal care.  She is currently monitored for the following issues for this low-risk pregnancy and has Gall stones; Overweight BMI=27.3; History of miscarriage, currently pregnant; Supervision of other normal pregnancy, antepartum; Chlamydia infection; Missed prenatal care x ~10 weeks; and Abnormal glucose tolerance test during pregnancy, antepartum on their problem list.  Patient reports no complaints.  Contractions: Not present. Vag. Bleeding: None.  Movement: Present. Denies leaking of fluid/ROM.   The following portions of the patient's history were reviewed and updated as appropriate: allergies, current medications, past family history, past medical history, past social history, past surgical history and problem list. Problem list updated.  Objective:   Vitals:   02/27/23 0840  BP: 101/69  Pulse: 90  Temp: 97.9 F (36.6 C)  Weight: 151 lb 3.2 oz (68.6 kg)    Fetal Status: Fetal Heart Rate (bpm): 142 Fundal Height: 33 cm Movement: Present     General:  Alert, oriented and cooperative. Patient is in no acute distress.  Skin: Skin is warm and dry. No rash noted.   Cardiovascular: Normal heart rate noted  Respiratory: Normal respiratory effort, no problems with respiration noted  Abdomen: Soft, gravid, appropriate for gestational age.  Pain/Pressure: Absent     Pelvic: Cervical exam deferred        Extremities: Normal range of motion.  Edema: None  Mental Status: Normal mood and affect. Normal behavior. Normal judgment and thought content.   Assessment and Plan:  Pregnancy: S0Y3016 at [redacted]w[redacted]d  1. 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 Chlamydia infection 02/06/23 neg TOC  3. Abnormal glucose tolerance test during pregnancy, antepartum 3 hour GTT equivocal on 02/10/23; ADA diet counseling referral made on 02/11/23 and hasn't had counseling apt yet; RN to call WIC  4. Missed prenatal care x ~10 weeks    Preterm labor symptoms and general obstetric precautions including but not limited to vaginal bleeding, contractions, leaking of fluid and fetal movement were reviewed in detail with the patient. Please refer to After Visit Summary for other counseling recommendations.  Return in about 2 weeks (around 03/13/2023) for routine PNC.  No future appointments.  Alberteen Spindle, CNM

## 2023-02-27 NOTE — Progress Notes (Signed)
Patient states has not yet received a call from [facility] for Diet Counseling. Left voice mail today at [facility] to call patient. BTHIELE RN

## 2023-02-27 NOTE — Telephone Encounter (Signed)
Per [facility] EPIC appt desk, has 0820 appt 02/27/2023. Jossie Ng, RN

## 2023-03-01 LAB — HIV-1/HIV-2 QUALITATIVE RNA
HIV-1 RNA, Qualitative: NONREACTIVE
HIV-2 RNA, Qualitative: NONREACTIVE

## 2023-03-01 LAB — RPR: RPR Ser Ql: NONREACTIVE

## 2023-03-03 NOTE — Telephone Encounter (Signed)
Pt called to reschedule missed appointment. Left VM with callback number

## 2023-03-05 ENCOUNTER — Telehealth: Payer: Self-pay

## 2023-03-05 NOTE — Telephone Encounter (Signed)
Call transferred to clinic by Royden Purl at request of RN. Last MHC RV completed 02/27/2023 and client states forgot to make a 1 week follow-up appt (EGA = 35 4/7 today). Schedule is full tomorrow in addition to 2 overbook appts. Client scheduled for 03/09/2023 with arrival time of 0800. Jossie Ng, RN

## 2023-03-06 NOTE — Progress Notes (Unsigned)
[facility] Department Maternal Health Clinic  PRENATAL VISIT NOTE  Subjective:  [name] is a 27 y.o. [phone] at [redacted]w[redacted]d being seen today for ongoing prenatal care.  She is currently monitored for the following issues for this low-risk pregnancy and has Gall stones; Overweight BMI=27.3; History of miscarriage, currently pregnant; Supervision of other normal pregnancy, antepartum; Chlamydia infection; Missed prenatal care x ~10 weeks; and Abnormal glucose tolerance test during pregnancy, antepartum on their problem list.  Patient reports {sx:14538}.   .  .   . ***Denies leaking of fluid/ROM.   The following portions of the patient's history were reviewed and updated as appropriate: allergies, current medications, past family history, past medical history, past social history, past surgical history and problem list. Problem list updated.  Objective:  There were no vitals filed for this visit.  Fetal Status:           General:  Alert, oriented and cooperative. Patient is in no acute distress.  Skin: Skin is warm and dry. No rash noted.   Cardiovascular: Normal heart rate noted  Respiratory: Normal respiratory effort, no problems with respiration noted  Abdomen: Soft, gravid, appropriate for gestational age.        Pelvic: {Blank single:19197::"Cervical exam performed","Cervical exam deferred"}        Extremities: Normal range of motion.     Mental Status: Normal mood and affect. Normal behavior. Normal judgment and thought content.   Assessment and Plan:  Pregnancy: S2G3151 at [redacted]w[redacted]d  1. Supervision of other normal pregnancy, antepartum -taking PNV daily? -weight -exercise  2. Abnormal glucose tolerance test during pregnancy, antepartum -seen by [facility] on 02/27/23?  3. [redacted] weeks gestation of pregnancy -standard 36 week labs     {Blank single:19197::"Term","Preterm"} labor symptoms and general obstetric precautions including but not limited to vaginal  bleeding, contractions, leaking of fluid and fetal movement were reviewed in detail with the patient. Please refer to After Visit Summary for other counseling recommendations.  No follow-ups on file.  Future Appointments  Date Time Provider Department Center  03/09/2023  8:20 AM [facility] PROVIDER [facility] None    Lenice Llamas, FNP

## 2023-03-09 ENCOUNTER — Ambulatory Visit: Payer: Self-pay | Admitting: Family Medicine

## 2023-03-09 VITALS — BP 103/72 | HR 87 | Temp 97.0°F | Wt 155.8 lb

## 2023-03-09 DIAGNOSIS — O99810 Abnormal glucose complicating pregnancy: Secondary | ICD-10-CM

## 2023-03-09 DIAGNOSIS — Z3480 Encounter for supervision of other normal pregnancy, unspecified trimester: Secondary | ICD-10-CM

## 2023-03-09 DIAGNOSIS — Z3483 Encounter for supervision of other normal pregnancy, third trimester: Secondary | ICD-10-CM

## 2023-03-09 DIAGNOSIS — Z3A36 36 weeks gestation of pregnancy: Secondary | ICD-10-CM

## 2023-03-09 LAB — URINALYSIS
Bilirubin, UA: NEGATIVE
Glucose, UA: NEGATIVE
Ketones, UA: NEGATIVE
Nitrite, UA: NEGATIVE
Protein,UA: NEGATIVE
RBC, UA: NEGATIVE
Specific Gravity, UA: 1.02 (ref 1.005–1.030)
Urobilinogen, Ur: 1 mg/dL (ref 0.2–1.0)
pH, UA: 6.5 (ref 5.0–7.5)

## 2023-03-09 NOTE — Progress Notes (Addendum)
Self collected specimens without difficulty. In [facility] reviewed by provider. BTHIELE RN

## 2023-03-09 NOTE — Addendum Note (Signed)
Addended by: Justice Milliron on: 03/09/2023 01:19 PM   Modules accepted: Orders  

## 2023-03-13 LAB — URINE CULTURE: Organism ID, Bacteria: NO GROWTH

## 2023-03-13 LAB — CHLAMYDIA/GC NAA, CONFIRMATION
Chlamydia trachomatis, NAA: NEGATIVE
Neisseria gonorrhoeae, NAA: NEGATIVE

## 2023-03-13 LAB — CULTURE, BETA STREP (GROUP B ONLY): Strep Gp B Culture: NEGATIVE

## 2023-03-13 NOTE — Progress Notes (Unsigned)
Erroneous- disregard

## 2023-03-17 ENCOUNTER — Telehealth: Payer: Self-pay

## 2023-03-17 ENCOUNTER — Encounter: Payer: Self-pay | Admitting: Family Medicine

## 2023-03-17 DIAGNOSIS — Z3A37 37 weeks gestation of pregnancy: Secondary | ICD-10-CM

## 2023-03-17 DIAGNOSIS — O99810 Abnormal glucose complicating pregnancy: Secondary | ICD-10-CM

## 2023-03-17 DIAGNOSIS — Z3480 Encounter for supervision of other normal pregnancy, unspecified trimester: Secondary | ICD-10-CM

## 2023-03-17 NOTE — Telephone Encounter (Signed)
[facility] for MHC RV 03/17/2023. Call to client and left message to call requesting she reschedule missed appt. Number to call provided. Jossie Ng, RN

## 2023-03-18 NOTE — Telephone Encounter (Signed)
[facility] for MHC RV 03/17/2023.   Called and spoke to patient. Appointment rescheduled for 03/23/23 at 10:00am arrival time.   Earlyne Iba, RN

## 2023-03-20 NOTE — Progress Notes (Unsigned)
[facility] Department Maternal Health Clinic  PRENATAL VISIT NOTE  Subjective:  [name] is a 27 y.o. [phone] at [redacted]w[redacted]d being seen today for ongoing prenatal care.  She is currently monitored for the following issues for this low-risk pregnancy and has Gall stones; Overweight BMI=27.3; History of miscarriage, currently pregnant; Supervision of other normal pregnancy, antepartum; Chlamydia Infection  10/21/22; Missed prenatal care x ~10 weeks; and Abnormal glucose tolerance test during pregnancy, antepartum on their problem list.  Patient reports contractions since yesterday .  Contractions: Irregular. Vag. Bleeding: None.  Movement: Present. Denies leaking of fluid/ROM.   The following portions of the patient's history were reviewed and updated as appropriate: allergies, current medications, past family history, past medical history, past social history, past surgical history and problem list. Problem list updated.  Objective:   Vitals:   03/23/23 1033  BP: 105/74  Pulse: 94  Temp: 98.2 F (36.8 C)  Weight: 157 lb 6.4 oz (71.4 kg)    Fetal Status: Fetal Heart Rate (bpm): 140 Fundal Height: 37 cm Movement: Present  Presentation: Vertex  General:  Alert, oriented and cooperative. Patient is in no acute distress.  Skin: Skin is warm and dry. No rash noted.   Cardiovascular: Normal heart rate noted  Respiratory: Normal respiratory effort, no problems with respiration noted  Abdomen: Soft, gravid, appropriate for gestational age.  Pain/Pressure: Present     Pelvic: Cervical exam deferred        Extremities: Normal range of motion.  Edema: None  Mental Status: Normal mood and affect. Normal behavior. Normal judgment and thought content.   Assessment and Plan:  Pregnancy: [id number] at [redacted]w[redacted]d  1. Supervision of other normal pregnancy, antepartum -taking PNV daily  2. Overweight BMI=27.3 27 lb 6.4 oz (12.4 kg) -exercise-walking every day- 1 hour  3.  Missed prenatal care x ~10 weeks -pt states she forgot  -reminded of importance of keeping visits  4. [redacted] weeks gestation of pregnancy -reviewed 36 week labs- negative     Term labor symptoms and general obstetric precautions including but not limited to vaginal bleeding, contractions, leaking of fluid and fetal movement were reviewed in detail with the patient. Please refer to After Visit Summary for other counseling recommendations.  No follow-ups on file.  Future Appointments  Date Time Provider Department Center  03/30/2023  8:20 AM [facility] PROVIDER [facility] None  04/06/2023  8:20 AM [facility] PROVIDER [facility] None    Lenice Llamas, FNP

## 2023-03-23 ENCOUNTER — Ambulatory Visit: Payer: Self-pay | Admitting: Family Medicine

## 2023-03-23 VITALS — BP 105/74 | HR 94 | Temp 98.2°F | Wt 157.4 lb

## 2023-03-23 DIAGNOSIS — Z91199 Patient's noncompliance with other medical treatment and regimen due to unspecified reason: Secondary | ICD-10-CM

## 2023-03-23 DIAGNOSIS — E663 Overweight: Secondary | ICD-10-CM

## 2023-03-23 DIAGNOSIS — Z3480 Encounter for supervision of other normal pregnancy, unspecified trimester: Secondary | ICD-10-CM

## 2023-03-23 DIAGNOSIS — Z3483 Encounter for supervision of other normal pregnancy, third trimester: Secondary | ICD-10-CM

## 2023-03-23 DIAGNOSIS — Z3A38 38 weeks gestation of pregnancy: Secondary | ICD-10-CM

## 2023-03-23 DIAGNOSIS — O09893 Supervision of other high risk pregnancies, third trimester: Secondary | ICD-10-CM

## 2023-03-23 NOTE — Progress Notes (Signed)
Counseled 36 week cultures were negative. One week MHC RV follow-up appt scheduled for 0800 03/30/2023 ([facility] work-in appt) and 0800 04/06/2023. Appt reminder card given for each appt. Per client, can't do pm appts. Jossie Ng, RN

## 2023-03-30 ENCOUNTER — Ambulatory Visit: Payer: Self-pay | Admitting: Advanced Practice Midwife

## 2023-03-30 VITALS — BP 103/68 | HR 90 | Temp 97.2°F | Wt 158.4 lb

## 2023-03-30 DIAGNOSIS — Z3480 Encounter for supervision of other normal pregnancy, unspecified trimester: Secondary | ICD-10-CM

## 2023-03-30 DIAGNOSIS — O09893 Supervision of other high risk pregnancies, third trimester: Secondary | ICD-10-CM

## 2023-03-30 DIAGNOSIS — Z91199 Patient's noncompliance with other medical treatment and regimen due to unspecified reason: Secondary | ICD-10-CM

## 2023-03-30 DIAGNOSIS — Z3483 Encounter for supervision of other normal pregnancy, third trimester: Secondary | ICD-10-CM

## 2023-03-30 DIAGNOSIS — E663 Overweight: Secondary | ICD-10-CM

## 2023-03-30 NOTE — Progress Notes (Signed)
[facility]  PRENATAL VISIT NOTE  Subjective:  [name] is a 27 y.o. [phone] at [redacted]w[redacted]d being seen today for ongoing prenatal care.  She is currently monitored for the following issues for this low-risk pregnancy and has Gall stones; Overweight BMI=27.3; History of miscarriage, currently pregnant; Supervision of other normal pregnancy, antepartum; Chlamydia Infection  10/21/22; Missed prenatal care x ~10 weeks: noncompliant with prenatal care; and Abnormal glucose tolerance test during pregnancy, antepartum on their problem list.  Patient reports no complaints.  Contractions: Irregular. Vag. Bleeding: None.  Movement: Present. Denies leaking of fluid/ROM.   The following portions of the patient's history were reviewed and updated as appropriate: allergies, current medications, past family history, past medical history, past social history, past surgical history and problem list. Problem list updated.  Objective:   Vitals:   03/30/23 0829  BP: 103/68  Pulse: 90  Temp: (!) 97.2 F (36.2 C)  Weight: 158 lb 6.4 oz (71.8 kg)    Fetal Status: Fetal Heart Rate (bpm): 140 Fundal Height: 37 cm Movement: Present  Presentation: Vertex  General:  Alert, oriented and cooperative. Patient is in no acute distress.  Skin: Skin is warm and dry. No rash noted.   Cardiovascular: Normal heart rate noted  Respiratory: Normal respiratory effort, no problems with respiration noted  Abdomen: Soft, gravid, appropriate for gestational age.  Pain/Pressure: Absent     Pelvic: Cervical exam performed Dilation: 1 Effacement (%): Thick Station: -3  Extremities: Normal range of motion.  Edema: None  Mental Status: Normal mood and affect. Normal behavior. Normal judgment and thought content.   Assessment and Plan:  Pregnancy: [id number] at [redacted]w[redacted]d  1. Supervision of other normal pregnancy, antepartum 28 lb 6.4 oz (12.9 kg) Not working Knows when to go  to L&D Desires IOL to be 04/12/23--paperwork completed Has car seat and crib Walking 5x/wk x 1 hour ADA diet counseling done 02/27/23  2. Missed prenatal care x ~10 weeks   3. Overweight BMI=27.3    Term labor symptoms and general obstetric precautions including but not limited to vaginal bleeding, contractions, leaking of fluid and fetal movement were reviewed in detail with the patient. Please refer to After Visit Summary for other counseling recommendations.  No follow-ups on file.  Future Appointments  Date Time Provider Department Center  04/06/2023  8:20 AM [facility] PROVIDER [facility] None    Alberteen Spindle, CNM

## 2023-03-31 ENCOUNTER — Other Ambulatory Visit: Payer: Self-pay

## 2023-03-31 ENCOUNTER — Encounter: Payer: Self-pay | Admitting: Obstetrics and Gynecology

## 2023-03-31 ENCOUNTER — Encounter
Admission: EM | Disposition: A | Discharge status: Home / Self Care | Payer: Self-pay | Source: Home / Self Care | Attending: Obstetrics

## 2023-03-31 ENCOUNTER — Inpatient Hospital Stay: Payer: Medicaid Other | Admitting: Anesthesiology

## 2023-03-31 ENCOUNTER — Inpatient Hospital Stay
Admission: EM | Admit: 2023-03-31 | Discharge: 2023-04-02 | DRG: 788 | Disposition: A | Discharge status: Home / Self Care | Payer: Medicaid Other | Attending: Obstetrics | Admitting: Obstetrics

## 2023-03-31 DIAGNOSIS — O479 False labor, unspecified: Principal | ICD-10-CM | POA: Diagnosis present

## 2023-03-31 DIAGNOSIS — Z3A39 39 weeks gestation of pregnancy: Secondary | ICD-10-CM

## 2023-03-31 DIAGNOSIS — O321XX0 Maternal care for breech presentation, not applicable or unspecified: Secondary | ICD-10-CM | POA: Diagnosis present

## 2023-03-31 DIAGNOSIS — O26893 Other specified pregnancy related conditions, third trimester: Secondary | ICD-10-CM | POA: Diagnosis present

## 2023-03-31 DIAGNOSIS — Z98891 History of uterine scar from previous surgery: Principal | ICD-10-CM

## 2023-03-31 LAB — CBC
HCT: 37.4 % (ref 36.0–46.0)
Hemoglobin: 12.8 g/dL (ref 12.0–15.0)
MCH: 29.4 pg (ref 26.0–34.0)
MCHC: 34.2 g/dL (ref 30.0–36.0)
MCV: 86 fL (ref 80.0–100.0)
Platelets: 416 10*3/uL — ABNORMAL HIGH (ref 150–400)
RBC: 4.35 MIL/uL (ref 3.87–5.11)
RDW: 13.3 % (ref 11.5–15.5)
WBC: 12.2 10*3/uL — ABNORMAL HIGH (ref 4.0–10.5)
nRBC: 0 % (ref 0.0–0.2)

## 2023-03-31 LAB — TYPE AND SCREEN
ABO/RH(D): O POS
Antibody Screen: NEGATIVE

## 2023-03-31 SURGERY — Surgical Case
Anesthesia: Epidural

## 2023-03-31 MED ORDER — LIDOCAINE HCL (PF) 1 % IJ SOLN
INTRAMUSCULAR | Status: DC | PRN
  Administered 2023-03-31: 3 mL

## 2023-03-31 MED ORDER — KETOROLAC TROMETHAMINE 30 MG/ML IJ SOLN
INTRAMUSCULAR | Status: DC | PRN
  Administered 2023-03-31: 30 mg via INTRAVENOUS

## 2023-03-31 MED ORDER — EPHEDRINE 5 MG/ML INJ: 10.0000 mg | INTRAVENOUS | Status: DC | PRN

## 2023-03-31 MED ORDER — ACETAMINOPHEN 325 MG PO TABS: 650.0000 mg | ORAL_TABLET | ORAL | Status: DC | PRN

## 2023-03-31 MED ORDER — SENNOSIDES-DOCUSATE SODIUM 8.6-50 MG PO TABS
2.0000 | ORAL_TABLET | Freq: Every day | ORAL | Status: DC
  Administered 2023-04-01 – 2023-04-02 (×2): 2 via ORAL
  Filled 2023-03-31 (×2): qty 2

## 2023-03-31 MED ORDER — LIDOCAINE HCL (PF) 1 % IJ SOLN
INTRAMUSCULAR | Status: AC
  Filled 2023-03-31: qty 30

## 2023-03-31 MED ORDER — SODIUM CHLORIDE 0.9 % IV SOLN
INTRAVENOUS | Status: DC | PRN
  Administered 2023-03-31: 500 mg via INTRAVENOUS

## 2023-03-31 MED ORDER — PRENATAL MULTIVITAMIN CH
1.0000 | ORAL_TABLET | Freq: Every day | ORAL | Status: DC
  Administered 2023-04-01 – 2023-04-02 (×2): 1 via ORAL
  Filled 2023-03-31 (×2): qty 1

## 2023-03-31 MED ORDER — SODIUM CHLORIDE 0.9 % IV SOLN
INTRAVENOUS | Status: AC
  Filled 2023-03-31: qty 5

## 2023-03-31 MED ORDER — LACTATED RINGERS IV SOLN: 500.0000 mL | INTRAVENOUS | Status: DC | PRN

## 2023-03-31 MED ORDER — LACTATED RINGERS IV SOLN: 500.0000 mL | Freq: Once | INTRAVENOUS | Status: DC

## 2023-03-31 MED ORDER — OXYCODONE HCL 5 MG PO TABS
5.0000 mg | ORAL_TABLET | ORAL | Status: DC | PRN
  Administered 2023-04-01: 10 mg via ORAL
  Filled 2023-03-31: qty 2

## 2023-03-31 MED ORDER — SOD CITRATE-CITRIC ACID 500-334 MG/5ML PO SOLN
30.0000 mL | ORAL | Status: DC | PRN
  Administered 2023-03-31: 30 mL via ORAL

## 2023-03-31 MED ORDER — IBUPROFEN 600 MG PO TABS
600.0000 mg | ORAL_TABLET | Freq: Four times a day (QID) | ORAL | Status: DC
  Administered 2023-04-01 – 2023-04-02 (×4): 600 mg via ORAL
  Filled 2023-03-31 (×4): qty 1

## 2023-03-31 MED ORDER — KETOROLAC TROMETHAMINE 30 MG/ML IJ SOLN: 30.0000 mg | Freq: Four times a day (QID) | INTRAMUSCULAR | Status: AC | PRN

## 2023-03-31 MED ORDER — MEPERIDINE HCL 25 MG/ML IJ SOLN: 6.2500 mg | INTRAMUSCULAR | Status: DC | PRN

## 2023-03-31 MED ORDER — AMMONIA AROMATIC IN INHA
RESPIRATORY_TRACT | Status: AC
  Filled 2023-03-31: qty 10

## 2023-03-31 MED ORDER — DEXAMETHASONE SODIUM PHOSPHATE 10 MG/ML IJ SOLN
INTRAMUSCULAR | Status: DC | PRN
  Administered 2023-03-31: 10 mg via INTRAVENOUS

## 2023-03-31 MED ORDER — ZOLPIDEM TARTRATE 5 MG PO TABS: 5.0000 mg | ORAL_TABLET | Freq: Every evening | ORAL | Status: DC | PRN

## 2023-03-31 MED ORDER — MENTHOL 3 MG MT LOZG: 1.0000 | LOZENGE | OROMUCOSAL | Status: DC | PRN

## 2023-03-31 MED ORDER — DIBUCAINE (PERIANAL) 1 % EX OINT: 1.0000 | TOPICAL_OINTMENT | CUTANEOUS | Status: DC | PRN

## 2023-03-31 MED ORDER — SODIUM CHLORIDE 0.9% FLUSH: 3.0000 mL | INTRAVENOUS | Status: DC | PRN

## 2023-03-31 MED ORDER — GABAPENTIN 300 MG PO CAPS
300.0000 mg | ORAL_CAPSULE | Freq: Every day | ORAL | Status: DC
  Administered 2023-04-01: 300 mg via ORAL
  Filled 2023-03-31 (×2): qty 1

## 2023-03-31 MED ORDER — PHENYLEPHRINE 80 MCG/ML (10ML) SYRINGE FOR IV PUSH (FOR BLOOD PRESSURE SUPPORT): 80.0000 ug | PREFILLED_SYRINGE | INTRAVENOUS | Status: DC | PRN

## 2023-03-31 MED ORDER — GABAPENTIN 300 MG PO CAPS
300.0000 mg | ORAL_CAPSULE | Freq: Once | ORAL | Status: AC
  Administered 2023-03-31: 300 mg via ORAL

## 2023-03-31 MED ORDER — OXYTOCIN-SODIUM CHLORIDE 30-0.9 UT/500ML-% IV SOLN
2.5000 [IU]/h | INTRAVENOUS | Status: DC
  Administered 2023-03-31 (×2): 30 [IU]/h via INTRAVENOUS
  Filled 2023-03-31: qty 1000

## 2023-03-31 MED ORDER — ACETAMINOPHEN 500 MG PO TABS
1000.0000 mg | ORAL_TABLET | Freq: Once | ORAL | Status: AC
  Administered 2023-03-31: 1000 mg via ORAL

## 2023-03-31 MED ORDER — CEFAZOLIN SODIUM-DEXTROSE 2-4 GM/100ML-% IV SOLN
INTRAVENOUS | Status: AC
  Filled 2023-03-31: qty 100

## 2023-03-31 MED ORDER — SOD CITRATE-CITRIC ACID 500-334 MG/5ML PO SOLN
ORAL | Status: AC
  Filled 2023-03-31: qty 15

## 2023-03-31 MED ORDER — ONDANSETRON HCL 4 MG/2ML IJ SOLN
INTRAMUSCULAR | Status: DC | PRN
  Administered 2023-03-31: 4 mg via INTRAVENOUS

## 2023-03-31 MED ORDER — OXYTOCIN-SODIUM CHLORIDE 30-0.9 UT/500ML-% IV SOLN
INTRAVENOUS | Status: AC
  Filled 2023-03-31: qty 500

## 2023-03-31 MED ORDER — MORPHINE SULFATE (PF) 0.5 MG/ML IJ SOLN
INTRAMUSCULAR | Status: AC
  Filled 2023-03-31: qty 10

## 2023-03-31 MED ORDER — DIPHENHYDRAMINE HCL 50 MG/ML IJ SOLN: 12.5000 mg | INTRAMUSCULAR | Status: DC | PRN

## 2023-03-31 MED ORDER — MORPHINE SULFATE (PF) 2 MG/ML IV SOLN: 1.0000 mg | INTRAVENOUS | Status: DC | PRN

## 2023-03-31 MED ORDER — FENTANYL-BUPIVACAINE-NACL 0.5-0.125-0.9 MG/250ML-% EP SOLN: 12.0000 mL/h | EPIDURAL | Status: DC | PRN

## 2023-03-31 MED ORDER — LIDOCAINE-EPINEPHRINE (PF) 1.5 %-1:200000 IJ SOLN
INTRAMUSCULAR | Status: DC | PRN
  Administered 2023-03-31: 3 mL via PERINEURAL

## 2023-03-31 MED ORDER — PHENYLEPHRINE 80 MCG/ML (10ML) SYRINGE FOR IV PUSH (FOR BLOOD PRESSURE SUPPORT)
PREFILLED_SYRINGE | INTRAVENOUS | Status: DC | PRN
  Administered 2023-03-31: 80 ug via INTRAVENOUS
  Administered 2023-03-31: 160 ug via INTRAVENOUS
  Administered 2023-03-31: 80 ug via INTRAVENOUS

## 2023-03-31 MED ORDER — ACETAMINOPHEN 500 MG PO TABS
1000.0000 mg | ORAL_TABLET | Freq: Four times a day (QID) | ORAL | Status: DC
  Administered 2023-03-31 – 2023-04-02 (×7): 1000 mg via ORAL
  Filled 2023-03-31 (×8): qty 2

## 2023-03-31 MED ORDER — OXYTOCIN-SODIUM CHLORIDE 30-0.9 UT/500ML-% IV SOLN
2.5000 [IU]/h | INTRAVENOUS | Status: AC
  Administered 2023-03-31: 2.5 [IU]/h via INTRAVENOUS

## 2023-03-31 MED ORDER — FENTANYL CITRATE (PF) 100 MCG/2ML IJ SOLN: 50.0000 ug | INTRAMUSCULAR | Status: DC | PRN

## 2023-03-31 MED ORDER — GABAPENTIN 300 MG PO CAPS
ORAL_CAPSULE | ORAL | Status: AC
  Filled 2023-03-31: qty 1

## 2023-03-31 MED ORDER — SIMETHICONE 80 MG PO CHEW: 80.0000 mg | CHEWABLE_TABLET | ORAL | Status: DC | PRN

## 2023-03-31 MED ORDER — LIDOCAINE HCL (PF) 1 % IJ SOLN: 30.0000 mL | INTRAMUSCULAR | Status: DC | PRN

## 2023-03-31 MED ORDER — KETOROLAC TROMETHAMINE 30 MG/ML IJ SOLN
30.0000 mg | Freq: Four times a day (QID) | INTRAMUSCULAR | Status: AC
  Administered 2023-03-31 – 2023-04-01 (×4): 30 mg via INTRAVENOUS
  Filled 2023-03-31 (×4): qty 1

## 2023-03-31 MED ORDER — DIPHENHYDRAMINE HCL 25 MG PO CAPS: 25.0000 mg | ORAL_CAPSULE | Freq: Four times a day (QID) | ORAL | Status: DC | PRN

## 2023-03-31 MED ORDER — MISOPROSTOL 200 MCG PO TABS
ORAL_TABLET | ORAL | Status: AC
  Filled 2023-03-31: qty 4

## 2023-03-31 MED ORDER — ENOXAPARIN SODIUM 40 MG/0.4ML IJ SOSY: 40.0000 mg | PREFILLED_SYRINGE | INTRAMUSCULAR | Status: DC

## 2023-03-31 MED ORDER — FENTANYL CITRATE (PF) 100 MCG/2ML IJ SOLN
INTRAMUSCULAR | Status: DC | PRN
  Administered 2023-03-31: 100 ug via EPIDURAL

## 2023-03-31 MED ORDER — FENTANYL CITRATE (PF) 100 MCG/2ML IJ SOLN
INTRAMUSCULAR | Status: AC
  Filled 2023-03-31: qty 2

## 2023-03-31 MED ORDER — CHLORHEXIDINE GLUCONATE 0.12 % MT SOLN
OROMUCOSAL | Status: AC
  Administered 2023-03-31: 15 mL
  Filled 2023-03-31: qty 15

## 2023-03-31 MED ORDER — ACETAMINOPHEN 500 MG PO TABS: 1000.0000 mg | ORAL_TABLET | Freq: Four times a day (QID) | ORAL | Status: DC

## 2023-03-31 MED ORDER — LACTATED RINGERS IV SOLN: INTRAVENOUS | Status: DC

## 2023-03-31 MED ORDER — SIMETHICONE 80 MG PO CHEW
80.0000 mg | CHEWABLE_TABLET | Freq: Three times a day (TID) | ORAL | Status: DC
  Administered 2023-03-31 – 2023-04-02 (×6): 80 mg via ORAL
  Filled 2023-03-31 (×6): qty 1

## 2023-03-31 MED ORDER — LIDOCAINE HCL (PF) 2 % IJ SOLN
INTRAMUSCULAR | Status: DC | PRN
  Administered 2023-03-31: 2 mL via EPIDURAL
  Administered 2023-03-31: 5 mL via EPIDURAL
  Administered 2023-03-31: 3 mL via EPIDURAL

## 2023-03-31 MED ORDER — SCOPOLAMINE 1 MG/3DAYS TD PT72: 1.0000 | MEDICATED_PATCH | Freq: Once | TRANSDERMAL | Status: DC

## 2023-03-31 MED ORDER — ENOXAPARIN SODIUM 40 MG/0.4ML IJ SOSY
40.0000 mg | PREFILLED_SYRINGE | INTRAMUSCULAR | Status: DC
  Administered 2023-04-01 – 2023-04-02 (×2): 40 mg via SUBCUTANEOUS
  Filled 2023-03-31 (×2): qty 0.4

## 2023-03-31 MED ORDER — FENTANYL-BUPIVACAINE-NACL 0.5-0.125-0.9 MG/250ML-% EP SOLN
EPIDURAL | Status: DC | PRN
  Administered 2023-03-31: 12 mL/h via EPIDURAL

## 2023-03-31 MED ORDER — BUPIVACAINE HCL (PF) 0.25 % IJ SOLN
INTRAMUSCULAR | Status: DC | PRN
  Administered 2023-03-31: 60 mL

## 2023-03-31 MED ORDER — BUPIVACAINE HCL (PF) 0.25 % IJ SOLN
INTRAMUSCULAR | Status: AC
  Filled 2023-03-31: qty 60

## 2023-03-31 MED ORDER — OXYTOCIN BOLUS FROM INFUSION: 333.0000 mL | Freq: Once | INTRAVENOUS | Status: DC

## 2023-03-31 MED ORDER — ONDANSETRON HCL 4 MG/2ML IJ SOLN: 4.0000 mg | Freq: Four times a day (QID) | INTRAMUSCULAR | Status: DC | PRN

## 2023-03-31 MED ORDER — WITCH HAZEL-GLYCERIN EX PADS: 1.0000 | MEDICATED_PAD | CUTANEOUS | Status: DC | PRN

## 2023-03-31 MED ORDER — ACETAMINOPHEN 500 MG PO TABS
ORAL_TABLET | ORAL | Status: AC
  Filled 2023-03-31: qty 2

## 2023-03-31 MED ORDER — NALOXONE HCL 4 MG/10ML IJ SOLN: 1.0000 ug/kg/h | INTRAVENOUS | Status: DC | PRN

## 2023-03-31 MED ORDER — NALOXONE HCL 0.4 MG/ML IJ SOLN: 0.4000 mg | INTRAMUSCULAR | Status: DC | PRN

## 2023-03-31 MED ORDER — CEFAZOLIN SODIUM-DEXTROSE 2-3 GM-%(50ML) IV SOLR
INTRAVENOUS | Status: DC | PRN
  Administered 2023-03-31: 2 g via INTRAVENOUS

## 2023-03-31 MED ORDER — OXYCODONE HCL 5 MG PO TABS
5.0000 mg | ORAL_TABLET | Freq: Four times a day (QID) | ORAL | Status: DC | PRN
  Administered 2023-03-31: 5 mg via ORAL
  Filled 2023-03-31: qty 1

## 2023-03-31 MED ORDER — FENTANYL-BUPIVACAINE-NACL 0.5-0.125-0.9 MG/250ML-% EP SOLN
EPIDURAL | Status: AC
  Filled 2023-03-31: qty 250

## 2023-03-31 MED ORDER — COCONUT OIL OIL: 1.0000 | TOPICAL_OIL | Status: DC | PRN

## 2023-03-31 MED ORDER — OXYTOCIN 10 UNIT/ML IJ SOLN
INTRAMUSCULAR | Status: AC
  Filled 2023-03-31: qty 2

## 2023-03-31 MED ORDER — BUPIVACAINE HCL (PF) 0.25 % IJ SOLN
INTRAMUSCULAR | Status: DC | PRN
  Administered 2023-03-31: 2 mL via EPIDURAL
  Administered 2023-03-31 (×2): 3 mL via EPIDURAL

## 2023-03-31 MED ORDER — MORPHINE SULFATE (PF) 0.5 MG/ML IJ SOLN
INTRAMUSCULAR | Status: DC | PRN
  Administered 2023-03-31: 3 mg via EPIDURAL

## 2023-03-31 SURGICAL SUPPLY — 34 items
APL PRP STRL LF DISP 70% ISPRP (MISCELLANEOUS) ×1
BARRIER ADHS 3X4 INTERCEED (GAUZE/BANDAGES/DRESSINGS) ×1 IMPLANT
BRR ADH 4X3 ABS CNTRL BYND (GAUZE/BANDAGES/DRESSINGS) ×1
CHLORAPREP W/TINT 26 (MISCELLANEOUS) ×1 IMPLANT
DRSG TELFA 3X8 NADH STRL (GAUZE/BANDAGES/DRESSINGS) ×1 IMPLANT
ELECT CAUTERY BLADE 6.4 (BLADE) ×1 IMPLANT
ELECT REM PT RETURN 9FT ADLT (ELECTROSURGICAL) ×1
ELECTRODE REM PT RTRN 9FT ADLT (ELECTROSURGICAL) ×1 IMPLANT
GAUZE SPONGE 4X4 12PLY STRL (GAUZE/BANDAGES/DRESSINGS) ×1 IMPLANT
GAUZE SPONGE 4X4 12PLY STRL LF (GAUZE/BANDAGES/DRESSINGS) IMPLANT
GLOVE SURG SYN 8.0 (GLOVE) ×1 IMPLANT
GLOVE SURG SYN 8.0 PF PI (GLOVE) ×1 IMPLANT
GOWN STRL REUS W/ TWL LRG LVL3 (GOWN DISPOSABLE) ×2 IMPLANT
GOWN STRL REUS W/ TWL XL LVL3 (GOWN DISPOSABLE) ×1 IMPLANT
GOWN STRL REUS W/TWL LRG LVL3 (GOWN DISPOSABLE) ×2
GOWN STRL REUS W/TWL XL LVL3 (GOWN DISPOSABLE) ×1
MANIFOLD NEPTUNE II (INSTRUMENTS) ×1 IMPLANT
MAT PREVALON FULL STRYKER (MISCELLANEOUS) ×1 IMPLANT
NDL HYPO 22X1.5 SAFETY MO (MISCELLANEOUS) ×1 IMPLANT
NEEDLE HYPO 22X1.5 SAFETY MO (MISCELLANEOUS) ×1 IMPLANT
NS IRRIG 1000ML POUR BTL (IV SOLUTION) ×1 IMPLANT
PACK C SECTION AR (MISCELLANEOUS) ×1 IMPLANT
PAD ABD 8X10 STRL (GAUZE/BANDAGES/DRESSINGS) IMPLANT
PAD OB MATERNITY 4.3X12.25 (PERSONAL CARE ITEMS) ×1 IMPLANT
PAD PREP OB/GYN DISP 24X41 (PERSONAL CARE ITEMS) ×1 IMPLANT
SCRUB CHG 4% DYNA-HEX 4OZ (MISCELLANEOUS) ×1 IMPLANT
STRAP SAFETY 5IN WIDE (MISCELLANEOUS) ×1 IMPLANT
SUT CHROMIC 1 CTX 36 (SUTURE) ×3 IMPLANT
SUT PLAIN GUT 0 (SUTURE) ×2 IMPLANT
SUT VIC AB 0 CT1 36 (SUTURE) ×2 IMPLANT
SYR 30ML LL (SYRINGE) ×2 IMPLANT
TAPE SURG TRANSPORE 1 IN (GAUZE/BANDAGES/DRESSINGS) IMPLANT
TRAP FLUID SMOKE EVACUATOR (MISCELLANEOUS) ×1 IMPLANT
WATER STERILE IRR 500ML POUR (IV SOLUTION) ×1 IMPLANT

## 2023-03-31 NOTE — Anesthesia Procedure Notes (Signed)
Epidural Patient location during procedure: OB Start time: 03/31/2023 5:26 AM End time: 03/31/2023 5:28 AM  Staffing Anesthesiologist: Louie Boston, MD Performed: anesthesiologist   Preanesthetic Checklist Completed: patient identified, IV checked, site marked, risks and benefits discussed, surgical consent, monitors and equipment checked, pre-op evaluation and timeout performed  Epidural Patient position: sitting Prep: ChloraPrep Patient monitoring: heart rate, continuous pulse ox and blood pressure Approach: midline Location: L3-L4 Injection technique: LOR saline  Needle:  Needle type: Tuohy  Needle gauge: 17 G Needle length: 9 cm and 9 Needle insertion depth: 6 cm Catheter type: closed end flexible Catheter size: 19 Gauge Catheter at skin depth: 11 cm Test dose: negative and 1.5% lidocaine with Epi 1:200 K  Assessment Sensory level: T10 Events: blood not aspirated, injection not painful, no injection resistance, no paresthesia and negative IV test  Additional Notes 1 attempt Pt. Evaluated and documentation done after procedure finished. Patient identified. Risks/Benefits/Options discussed with patient including but not limited to bleeding, infection, nerve damage, paralysis, failed block, incomplete pain control, headache, blood pressure changes, nausea, vomiting, reactions to medication both or allergic, itching and postpartum back pain. Confirmed with bedside nurse the patient's most recent platelet count. Confirmed with patient that they are not currently taking any anticoagulation, have any bleeding history or any family history of bleeding disorders. Patient expressed understanding and wished to proceed. All questions were answered. Sterile technique was used throughout the entire procedure. Please see nursing notes for vital signs. Test dose was given through epidural catheter and negative prior to continuing to dose epidural or start infusion. Warning signs of high block  given to the patient including shortness of breath, tingling/numbness in hands, complete motor block, or any concerning symptoms with instructions to call for help. Patient was given instructions on fall risk and not to get out of bed. All questions and concerns addressed with instructions to call with any issues or inadequate analgesia.    Patient tolerated the insertion well without immediate complications. Reason for block:procedure for pain

## 2023-03-31 NOTE — Discharge Summary (Signed)
Obstetrical Discharge Summary Patient Name: [name] DOB: [birth year] MRN: [id number]   Date of Admission: 03/31/2023 Date of Delivery: 03/31/23 Delivered by: Beverly Gust MD Date of Discharge: 04/02/2023   Primary OB: [facility] [id number] last menstrual period was 06/05/2022 (approximate). EDC Estimated Date of Delivery: 04/05/23 Gestational Age at Delivery: [redacted]w[redacted]d    Antepartum complications:  Chlamydia 11/23 Missed prenatal care visits x 10 weeks Abnormal glucose tolerance test   Admitting Diagnosis: Uterine contractions [O47.9]  Secondary Diagnosis:     Patient Active Problem List    Diagnosis Date Noted   Status post primary low transverse cesarean section 04/02/2023   Abnormal glucose tolerance test during pregnancy, antepartum 02/09/2023   Missed prenatal care x ~10 weeks: noncompliant with prenatal care 02/06/2023   Chlamydia Infection  10/21/22 10/15/2022   Supervision of other normal pregnancy, antepartum 10/09/2022   History of miscarriage, currently pregnant 06/14/2021   Overweight BMI=27.3 12/21/2020   Gall stones 03/22/2014      Discharge Diagnosis: Term Pregnancy Delivered       Augmentation: N/A Complications: None Intrapartum complications/course: see op note Delivery Type: primary cesarean section, low transverse incision Anesthesia: epidural anesthesia Placenta: spontaneous          To Pathology: No  Laceration: n/a Episiotomy: none Newborn Data: Live born female  Birth Weight: 7 lb 7.6 oz (3390 g) APGAR: 9, 9   Newborn Delivery   Birth date/time: 03/31/2023 11:08:00 Delivery type: C-Section, Low Transverse C-section categorization: Primary        Postpartum Procedures: none [place]:        No data to display             Post partum course:  Patient had an uncomplicated postpartum course.  By time of discharge on POD#2, her pain was controlled on oral pain medications; she had appropriate lochia and was ambulating, voiding  without difficulty, tolerating regular diet and passing flatus.   She was deemed stable for discharge to home.     Discharge Physical Exam:  BP 99/65 (BP Location: Right Arm)   Pulse 85   Temp 98.3 F (36.8 C) (Oral)   Resp 20   Ht 4\' 11"  (1.499 m)   Wt 71.7 kg   LMP 06/05/2022 (Approximate)   SpO2 98%   Breastfeeding Unknown   BMI 31.91 kg/m    General: NAD CV: RRR Pulm: nl effort ABD: s/nd/nt, fundus firm and below the umbilicus Lochia: moderate Perineum:minimal edema/intact Incision: c/d/I, covered with occlusive OP site dressing  DVT Evaluation: LE non-ttp, no evidence of DVT on exam.   Last Labs       Hemoglobin  Date Value Ref Range Status  04/01/2023 10.0 (L) 12.0 - 15.0 g/dL Final  65/78/4696 29.5 11.1 - 15.9 g/dL Final         HCT  Date Value Ref Range Status  04/01/2023 29.4 (L) 36.0 - 46.0 % Final         Hematocrit  Date Value Ref Range Status  10/09/2022 38.1 34.0 - 46.6 % Final        Risk assessment for postpartum VTE and prophylactic treatment: Very high risk factors: None High risk factors: None Moderate risk factors: BMI 30-40 kg/m2   Postpartum VTE prophylaxis with LMWH not indicated   Disposition: stable, discharge to home. Baby Feeding: formula feeding Baby Disposition: home with mom   Rh Immune globulin indicated: No Rubella vaccine given: was not indicated Varivax vaccine given: was not indicated Flu vaccine given in AP  setting: n/a Tdap vaccine given in AP setting: Yes    Contraception:  Nexplanon   Prenatal Labs:  Blood type/Rh O pos  Antibody screen neg  Rubella Immune  Varicella Immune  RPR NR  HBsAg Neg  HIV NR  GC neg  Chlamydia neg  Genetic screening negative  1 hour GTT 156  3 hour GTT 76, 146, 155, 104  GBS negative      Plan:  [name] was discharged to home in good condition.     Discharge Medications: Allergies as of 04/02/2023   No Known Allergies         Medication List        TAKE these medications     acetaminophen 500 MG tablet Commonly known as: TYLENOL Take 1 tablet (500 mg total) by mouth every 6 (six) hours as needed.    ibuprofen 600 MG tablet Commonly known as: ADVIL Take 1 tablet (600 mg total) by mouth every 6 (six) hours as needed.    oxyCODONE 5 MG immediate release tablet Commonly known as: Oxy IR/ROXICODONE Take 1 tablet (5 mg total) by mouth every 6 (six) hours as needed for severe pain.    prenatal multivitamin Tabs tablet Take 1 tablet by mouth daily at 12 noon.               Follow-up Information       Leilan Bochenek, Ihor Austin, MD Follow up in 2 week(s).   Specialty: Obstetrics and Gynecology Why: for a post op appointment Contact information: [address] [place] [address] [phone]

## 2023-03-31 NOTE — Transfer of Care (Signed)
Immediate Anesthesia Transfer of Care Note  Patient: [place] [name]-Castillo  Procedure(s) Performed: CESAREAN SECTION  Patient Location: Mother/Baby  Anesthesia Type:Epidural  Level of Consciousness: awake, alert , and oriented  Airway & Oxygen Therapy: Patient Spontanous Breathing  Post-op Assessment: Report given to RN and Post -op Vital signs reviewed and stable  Post vital signs: Reviewed and stable  Last Vitals:  Vitals Value Taken Time  BP 105/74 03/31/23 1149  Temp    Pulse 98 03/31/23 1150  Resp 15 03/31/23 1150  SpO2 98 % 03/31/23 1150  Vitals shown include unvalidated device data.  Last Pain:  Vitals:   03/31/23 0757  TempSrc:   PainSc: 0-No pain         Complications: No notable events documented.

## 2023-03-31 NOTE — Progress Notes (Signed)
Pt laboring and on cx exam by cnm Tami Lin  she was noted to be breech . Cx 4 cm / 60% . I have counseled the pt for the role of c/s  .She agrees . Declines BTL .  The risks of cesarean section discussed with the patient included but were not limited to: bleeding which may require transfusion or reoperation; infection which may require antibiotics; injury to bowel, bladder, ureters or other surrounding organs; injury to the fetus; need for additional procedures including hysterectomy in the event of a life-threatening hemorrhage; placental abnormalities wth subsequent pregnancies, incisional problems, thromboembolic phenomenon and other postoperative/anesthesia complications. The patient concurred with the proposed plan, giving informed written consent for the procedure.    she will remain NPO for procedure. Anesthesia and OR aware. Preoperative prophylactic antibiotics and SCDs ordered on call to the OR.  To OR when ready.

## 2023-03-31 NOTE — Anesthesia Preprocedure Evaluation (Addendum)
Anesthesia Evaluation  Patient identified by MRN, date of birth, ID band Patient awake    Reviewed: Allergy & Precautions, NPO status , Patient's Chart, lab work & pertinent test results  History of Anesthesia Complications Negative for: history of anesthetic complications  Airway Mallampati: IV  TM Distance: >3 FB Neck ROM: full    Dental no notable dental hx.    Pulmonary neg pulmonary ROS   Pulmonary exam normal        Cardiovascular Exercise Tolerance: Good negative cardio ROS Normal cardiovascular exam     Neuro/Psych    GI/Hepatic negative GI ROS,,,  Endo/Other    Renal/GU   negative genitourinary   Musculoskeletal   Abdominal   Peds  Hematology negative hematology ROS (+)   Anesthesia Other Findings Past Medical History: No date: Anemia     Comment:  during 2nd pregnancy  Past Surgical History: 05/2014: CHOLECYSTECTOMY 06/26/2021: DILATION AND EVACUATION; N/A     Comment:  Procedure: DILATATION AND EVACUATION;  Surgeon: Christeen Douglas, MD;  Location: [facility] ORS;  Service: Gynecology;                Laterality: N/A;  BMI    Body Mass Index: 31.91 kg/m      Reproductive/Obstetrics (+) Pregnancy                             Anesthesia Physical Anesthesia Plan  ASA: 2  Anesthesia Plan: Epidural   Post-op Pain Management:    Induction:   PONV Risk Score and Plan:   Airway Management Planned: Natural Airway  Additional Equipment:   Intra-op Plan:   Post-operative Plan:   Informed Consent: I have reviewed the patients History and Physical, chart, labs and discussed the procedure including the risks, benefits and alternatives for the proposed anesthesia with the patient or authorized representative who has indicated his/her understanding and acceptance.     Dental Advisory Given  Plan Discussed with: Anesthesiologist  Anesthesia Plan Comments:  (Patient reports no bleeding problems and no anticoagulant use.  Patient consented for risks of anesthesia including but not limited to:  - adverse reactions to medications - risk of bleeding, infection and or nerve damage from epidural that could lead to paralysis - risk of headache or failed epidural - nerve damage due to positioning - that if epidural is used for C-section that there is a chance of epidural failure requiring spinal placement or conversion to GA - Damage to heart, brain, lungs, other parts of body or loss of life  Patient voiced understanding.)       Anesthesia Quick Evaluation

## 2023-03-31 NOTE — H&P (Signed)
OB History & Physical   History of Present Illness:  Chief Complaint:   HPI:  [name] is a 27 y.o. [id number] female at [redacted]w[redacted]d dated by [redacted]w[redacted]d [place].  She presents to L&D for painful uterine contractions    Pregnancy Issues: 1. Chlamydia 11/23 2. Missed prenatal care visits x 10 weeks 3. Abnormal glucose tolerance test   Maternal Medical History:   Past Medical History:  Diagnosis Date   Anemia    during 2nd pregnancy    Past Surgical History:  Procedure Laterality Date   CHOLECYSTECTOMY  05/2014   DILATION AND EVACUATION N/A 06/26/2021   Procedure: DILATATION AND EVACUATION;  Surgeon: Christeen Douglas, MD;  Location: [facility] ORS;  Service: Gynecology;  Laterality: N/A;    No Known Allergies  Prior to Admission medications   Medication Sig Start Date End Date Taking? Authorizing Provider  Prenatal Vit-Fe Fumarate-FA (PRENATAL MULTIVITAMIN) TABS tablet Take 1 tablet by mouth daily at 12 noon. 12/04/22   Lenice Llamas, FNP    Prenatal care site: [facility]   Social History: She  reports that she has never smoked. She has never been exposed to tobacco smoke. She has never used smokeless tobacco. She reports that she does not currently use alcohol. She reports that she does not use drugs.  Family History: family history includes Healthy in her brother, daughter, daughter, father, maternal grandfather, maternal grandmother, mother, paternal grandfather, paternal grandmother, sister, sister, sister, sister, and son.   Review of Systems: A full review of systems was performed and negative except as noted in the HPI.     Physical Exam:  Vital Signs: BP 104/68   Pulse 87   Temp 98.2 F (36.8 C)   Resp 16   Ht 4\' 11"  (1.499 m)   Wt 71.7 kg   LMP 06/05/2022 (Approximate)   BMI 31.91 kg/m  General: no acute distress.  HEENT: normocephalic, atraumatic Heart: regular rate & rhythm.  No murmurs/rubs/gallops Lungs: clear to auscultation  bilaterally, normal respiratory effort Abdomen: soft, gravid, non-tender;  EFW: 7lb Pelvic:   External: Normal external female genitalia  Cervix: Dilation: 4.5 / Effacement (%): 60 / Station: -2    Extremities: non-tender, symmetric, mild edema bilaterally.  DTRs: +2  Neurologic: Alert & oriented x 3.    No results found for this or any previous visit (from the past 24 hour(s)).  Pertinent Results:  Prenatal Labs: Blood type/Rh O pos  Antibody screen neg  Rubella Immune  Varicella Immune  RPR NR  HBsAg Neg  HIV NR  GC neg  Chlamydia neg  Genetic screening negative  1 hour GTT 156  3 hour GTT 76, 146, 155, 104  GBS negative   FHT: 140bpm, moderate variability, accelerations present, no decelerations, Category I tracing TOCO: contractions q2-30min SVE:  Dilation: 4.5 / Effacement (%): 60 / Station: -2    Cephalic by leopolds  No results found.  Assessment:  [name] is a 27 y.o. [phone] female at [redacted]w[redacted]d with uterine contractions.   Plan:  1. Admit to Labor & Delivery; consents reviewed and obtained  2. Fetal Well being  - Fetal Tracing: Category I tracing - Group B Streptococcus ppx indicated: n/a, GBS negative - Presentation: vertex confirmed by SVE   3. Routine OB: - Prenatal labs reviewed, as above - Rh pos - CBC, T&S, RPR on admit - Clear fluids, saline lock  4. Monitoring of Labor -  Contractions q2-59min, external toco in place -  Pelvis proven to  3147g -  Plan for continuous fetal monitoring  -  Maternal pain control as desired; requesting regional anesthesia - Anticipate vaginal delivery  5. Post Partum Planning: - Infant feeding: formula feeding - Contraception: Nexplanon  Janyce Llanos, CNM 03/31/23 3:51 AM

## 2023-03-31 NOTE — Brief Op Note (Signed)
03/31/2023  11:43 AM  PATIENT:  [name]  27 y.o. female  PRE-OPERATIVE DIAGNOSIS:  Unscheduled Cesarean Section, See Delivery Summary. Breech presentation in active labor   POST-OPERATIVE DIAGNOSIS:  Unscheduled Cesarean Section, See Delivery Summary, same   PROCEDURE:  Procedure(s): CESAREAN SECTIONLTCS and breech extraction   SURGEON:  Surgeon(s) and Role:    * Vilda Zollner, Ihor Austin, MD - Primary  PHYSICIAN ASSISTANT: Margaretmary Eddy , CNM   ASSISTANTS: none   ANESTHESIA:   epidural  EBL:  qbl 150 cc IOF 300 cc  UO 60 cc  BLOOD ADMINISTERED:none  DRAINS: foley  LOCAL MEDICATIONS USED:  MARCAINE     SPECIMEN:  Source of Specimen:  n/a  DISPOSITION OF SPECIMEN:  N/A  COUNTS:  YES  TOURNIQUET:  * No tourniquets in log *  DICTATION: .Other Dictation: Dictation Number verbal  PLAN OF CARE: Admit to inpatient   PATIENT DISPOSITION:  PACU - hemodynamically stable.   Delay start of Pharmacological VTE agent (>24hrs) due to surgical blood loss or risk of bleeding: not applicable

## 2023-03-31 NOTE — Progress Notes (Signed)
L&D Note    Subjective:  Comfortable with epidural   Objective:   Vitals:   03/31/23 0900 03/31/23 0905 03/31/23 0910 03/31/23 0915  BP:      Pulse:      Resp:      Temp:      TempSrc:      SpO2: 97% 97% 97% 96%  Weight:      Height:        Current Vital Signs 24h Vital Sign Ranges  T 98.9 F (37.2 C) Temp  Avg: 98.5 F (36.9 C)  Min: 98.2 F (36.8 C)  Max: 98.9 F (37.2 C)  BP 98/67 BP  Min: 80/50  Max: 131/89  HR 93 Pulse  Avg: 92  Min: 84  Max: 112  RR 18 Resp  Avg: 17.3  Min: 16  Max: 18  SaO2 96 %   SpO2  Avg: 98.1 %  Min: 96 %  Max: 99 %      Gen: alert, cooperative, no distress FHR: Baseline: 140 bpm, Variability: moderate, Accels: Present, Decels: none Toco: 2-7 minutes  SVE: Dilation: 4.5 Effacement (%): 60 Cervical Position: Middle Station: -3 Presentation: Homero Fellers Breech Exam by:: Margaretmary Eddy, CNM  Medications SCHEDULED MEDICATIONS   acetaminophen       ammonia       bupivacaine (PF)       gabapentin       lidocaine (PF)       misoprostol       oxytocin       oxytocin 40 units in LR 1000 mL  333 mL Intravenous Once   Oxytocin-Sodium Chloride       sodium citrate-citric acid        MEDICATION INFUSIONS   ceFAZolin     fentaNYL 2 mcg/mL w/bupivacaine 0.125% in NS 250 mL     lactated ringers     lactated ringers     lactated ringers 125 mL/hr at 03/31/23 0803   oxytocin     sodium chloride 0.9 % with azithromycin (ZITHROMAX) ADS Med      PRN MEDICATIONS  acetaminophen, acetaminophen, ammonia, bupivacaine (PF), ceFAZolin, diphenhydrAMINE, ePHEDrine, ePHEDrine, fentaNYL (SUBLIMAZE) injection, fentaNYL 2 mcg/mL w/bupivacaine 0.125% in NS 250 mL, gabapentin, lactated ringers, lidocaine (PF), lidocaine (PF), misoprostol, ondansetron, oxytocin, Oxytocin-Sodium Chloride, phenylephrine, phenylephrine, sodium chloride 0.9 % with azithromycin (ZITHROMAX) ADS Med, sodium citrate-citric acid, sodium citrate-citric acid   Assessment & Plan:  27 y.o. [id number]  at [redacted]w[redacted]d admitted for labor  -Labor: Early latent labor. -Fetal Well-being: Category I -GBS: negative -Membranes intact -Breech presentation felt with SVE and Leopolds.  Bedside US performed and confirmed breech presentation.  Dr. Feliberto Gottron notified of presentation and need for cesarean birth.  [place] counseled along with her sister on risk/benefits of cesarean birth and indications.  Anesthesia and OR team notified.  Will move to OR when ready.  -Analgesia: regional anesthesia   Gustavo Lah, CNM  03/31/2023 10:37 AM  [facility]

## 2023-03-31 NOTE — Op Note (Signed)
NAME: [name], [place] MEDICAL RECORD NO: [id number] ACCOUNT NO: [id number] DATE OF BIRTH: [birth year] FACILITY: [facility] LOCATION: [facility] PHYSICIAN: Suzy Bouchard, MD  Operative Report   DATE OF PROCEDURE: 03/31/2023  PREOPERATIVE DIAGNOSIS: 1.  39+2 weeks estimated gestational age. 2.  Active labor. 3.  Breech presentation.  POSTOPERATIVE DIAGNOSIS:   1.  39+2 weeks estimated gestational age. 2.  Active labor. 3.  Breech presentation. 4.  Vigorous female, delivered.  PROCEDURE: 1.  Primary low transverse cesarean section. 2.  Breech extraction.  SURGEON:  Suzy Bouchard, MD  FIRST ASSISTANT:  Margaretmary Eddy, certified nurse midwife.  ANESTHESIA:  Surgical dosing of continuous lumbar epidural.  INDICATIONS:  27 year old gravida 5, para 3, patient in active labor with cervix checked at 4 cm dilated and fetus noted to be in the breech presentation.  Ultrasound confirmed.  DESCRIPTION OF PROCEDURE:  After adequate surgical dosing of continuous lumbar epidural, the patient was placed in dorsal supine position, hip roll on the right side.  The patient's abdomen was prepped and draped in normal sterile fashion.  The patient  did receive 2 grams of IV Ancef and 500 mg azithromycin for surgical prophylaxis.  Timeout was performed.  A Pfannenstiel incision was made 2 fingerbreadths above the symphysis pubis.  Sharp dissection was used to identify the fascia.  Fascia was opened  in the midline and opened in a transverse fashion.  Superior aspect of the fascia was grasped with Kocher clamps and the recti muscles were dissected free.  Inferior aspect of the fascia was grasped with Kocher clamps and the pyramidalis muscle was  dissected free.  Entry into the peritoneal cavity was accomplished sharply.  Vesicouterine peritoneal fold was identified and created and the bladder was reflected inferiorly.  Low transverse uterine incision was made.  Upon entry into the  endometrial  cavity, clear fluid resulted.  The incision was extended with blunt transverse traction.  Fetal buttocks were brought through the incision and the legs were delivered with a medial sweeping maneuver. The lower abdomen was draped with a wound towel and  the arms were delivered with a medial sweeping maneuver to reduce the nuchal arm on the right.  Head was delivered with a Mauriceau-Smellie-Veit maneuver.  Vigorous female was then dried on the mother's abdomen for 60 seconds.  Cord was clamped and  vigorous female was passed to nursery staff who assigned Apgar scores of 9 and 9, fetal weight 7 pounds 8 ounces.  Placenta was manually delivered and the uterus was exteriorized.  The endometrial cavity was wiped clean with laparotomy tape.  Uterine  incision was closed with 1-0 chromic suture in a running locking fashion, good approximation of edges.  Good hemostasis noted.  Fallopian tubes and ovaries appeared normal.  Posterior cul-de-sac was irrigated and suctioned and the uterus was placed back  into the abdominal cavity.  Pericolic gutters were wiped clean with laparotomy tape and the uterine incision again appeared hemostatic and Interceed was placed over the uterine incision in T-shape fashion.  Fascia was closed with 0 Vicryl suture in a  running nonlocking fashion, good approximation of edges.  Fascial edges were injected with a solution of 60 mL 0.25% Marcaine plus 20 mL normal saline.  30 mL of this solution was used.  Subcutaneous tissues were irrigated and bovied for hemostasis and  the skin was reapproximated with staples.  Additional 30 mL of Marcaine solution was injected beneath the skin.  There were no complications.  The patient tolerated the  procedure well.  QUANTITATIVE BLOOD LOSS:  150 mL.  URINE OUTPUT:  60 mL  INTRAOPERATIVE FLUIDS:  300 mL.  DISPOSITION:  The patient was taken to recovery room in good condition.   PUS D: 03/31/2023 11:53:11 am T: 03/31/2023 1:03:00  pm  JOB: [id number]/ [id number]

## 2023-04-01 ENCOUNTER — Encounter: Payer: Self-pay | Admitting: Obstetrics and Gynecology

## 2023-04-01 LAB — CBC
HCT: 29.4 % — ABNORMAL LOW (ref 36.0–46.0)
Hemoglobin: 10 g/dL — ABNORMAL LOW (ref 12.0–15.0)
MCH: 29.4 pg (ref 26.0–34.0)
MCHC: 34 g/dL (ref 30.0–36.0)
MCV: 86.5 fL (ref 80.0–100.0)
Platelets: 319 10*3/uL (ref 150–400)
RBC: 3.4 MIL/uL — ABNORMAL LOW (ref 3.87–5.11)
RDW: 13.4 % (ref 11.5–15.5)
WBC: 14.2 10*3/uL — ABNORMAL HIGH (ref 4.0–10.5)
nRBC: 0 % (ref 0.0–0.2)

## 2023-04-01 MED ORDER — LACTATED RINGERS IV BOLUS
500.0000 mL | Freq: Once | INTRAVENOUS | Status: AC
  Administered 2023-04-01: 500 mL via INTRAVENOUS

## 2023-04-01 NOTE — Lactation Note (Signed)
This note was copied from a baby's chart. Lactation Consultation Note  Patient Name: [name] Today's Date: 04/01/2023 Age:27 hours Reason for consult: Initial assessment;Term   Maternal Data Has patient been taught Hand Expression?: Yes Does the patient have breastfeeding experience prior to this delivery?: Yes How long did the patient breastfeed?: short periods  P4, c-section due to breech presentation when mom went into labor. Mom presented with desire to primarily formula feed- she has put baby to the breast, but states that baby is never full and she has never produced a full milk supply for her children.  Feeding Mother's Current Feeding Choice: Formula Nipple Type: Slow - flow  Baby tolerating some breastfeeds and formula well. Parents voice no concerns.   LATCH Score  Lactation Tools Discussed/Used    Interventions Interventions: Breast feeding basics reviewed;Hand express;Hand pump;Education (Building milk supply, offering breast before bottle, successful latch/keeping baby interested at the breast)  Talked with mom about ideas on how to increase her milk supply: BF frequency, breast stimulation and milk removal, use of pump (hand or electric). Mom verbalizes understanding of the information, but continues to desire more formula feeding.  Encouraged her to call out for support if desired.  Discharge Discharge Education: Engorgement and breast care [facility] Program: Yes  Consult Status Consult Status: Complete    Danford Bad 04/01/2023, 10:25 AM

## 2023-04-01 NOTE — Progress Notes (Signed)
Postop Day  1  Subjective: up ad lib, tolerating PO, and + flatus reports she is unable to void  Doing well, no concerns. Ambulating without difficulty, pain managed with PO meds, tolerating regular diet, and voiding without difficulty.   No fever/chills, chest pain, shortness of breath, nausea/vomiting, or leg pain. No nipple or breast pain. No headache, visual changes, or RUQ/epigastric pain.  Objective: BP 91/61 (BP Location: Right Arm)   Pulse 75   Temp 98.1 F (36.7 C) (Oral)   Resp 20   Ht 4\' 11"  (1.499 m)   Wt 71.7 kg   LMP 06/05/2022 (Approximate)   SpO2 96%   Breastfeeding Unknown   BMI 31.91 kg/m    Physical Exam:  General: alert, cooperative, and appears stated age Breasts: soft/nontender CV: RRR Pulm: nl effort, CTABL Abdomen: soft, non-tender, active bowel sounds Uterine Fundus: firm Incision: healing well, no significant drainage, no dehiscence, no significant erythema Perineum: minimal edema, intact Lochia: appropriate DVT Evaluation: No evidence of DVT seen on physical exam. Negative Homan's sign. No cords or calf tenderness. No significant calf/ankle edema.  Recent Labs    03/31/23 0409 04/01/23 0638  HGB 12.8 10.0*  HCT 37.4 29.4*  WBC 12.2* 14.2*  PLT 416* 319    Assessment/Plan: 27 y.o. [id number] postpartum day # 1  -Continue routine postpartum care -Lactation consult PRN for breastfeeding  -Straight cath @ 1140 am for 550 cc, will do a voiding trial in 2 hours with PVR   Disposition: Continue inpatient postpartum care    LOS: 1 day   Heberto Sturdevant Wonda Amis, CNM 04/01/2023, 12:50 PM   ----- Chari Manning Certified Nurse Midwife [facility]

## 2023-04-01 NOTE — Anesthesia Postprocedure Evaluation (Signed)
Anesthesia Post Note  Patient: [name]  Procedure(s) Performed: CESAREAN SECTION  Patient location during evaluation: Mother Baby Anesthesia Type: Epidural Level of consciousness: awake Pain management: pain level controlled Respiratory status: spontaneous breathing Cardiovascular status: stable Postop Assessment: no headache Anesthetic complications: no   No notable events documented.   Last Vitals:  Vitals:   03/31/23 2351 04/01/23 0413  BP: 102/64 110/68  Pulse: 84 86  Resp: 18 19  Temp: 37.1 C 36.8 C  SpO2: 98% 96%    Last Pain:  Vitals:   04/01/23 0413  TempSrc: Oral  PainSc: 0-No pain                 Jaye Beagle

## 2023-04-02 DIAGNOSIS — Z98891 History of uterine scar from previous surgery: Principal | ICD-10-CM

## 2023-04-02 MED ORDER — ACETAMINOPHEN 500 MG PO TABS: 500.0000 mg | ORAL_TABLET | Freq: Four times a day (QID) | ORAL | Status: DC | PRN

## 2023-04-02 MED ORDER — OXYCODONE HCL 5 MG PO TABS: 5.0000 mg | ORAL_TABLET | Freq: Four times a day (QID) | ORAL | 0 refills | Status: DC | PRN

## 2023-04-02 MED ORDER — IBUPROFEN 600 MG PO TABS: 600.0000 mg | ORAL_TABLET | Freq: Four times a day (QID) | ORAL | Status: DC | PRN

## 2023-04-02 NOTE — Progress Notes (Signed)
Patient discharged. Discharge instructions given. Patient verbalizes understanding. Transported by staff. 

## 2023-04-02 NOTE — Discharge Summary (Signed)
Obstetrical Discharge Summary  Patient Name: [name] DOB: January 18, 1996 MRN: [id number]  Date of Admission: 03/31/2023 Date of Delivery: 03/31/23 Delivered by: Beverly Gust MD Date of Discharge: 04/02/2023  Primary OB: [facility] [id number] last menstrual period was 06/05/2022 (approximate). EDC Estimated Date of Delivery: 04/05/23 Gestational Age at Delivery: [redacted]w[redacted]d   Antepartum complications:  Chlamydia 11/23 Missed prenatal care visits x 10 weeks Abnormal glucose tolerance test  Admitting Diagnosis: Uterine contractions [O47.9]  Secondary Diagnosis: Patient Active Problem List   Diagnosis Date Noted   Status post primary low transverse cesarean section 04/02/2023   Abnormal glucose tolerance test during pregnancy, antepartum 02/09/2023   Missed prenatal care x ~10 weeks: noncompliant with prenatal care 02/06/2023   Chlamydia Infection  10/21/22 10/15/2022   Supervision of other normal pregnancy, antepartum 10/09/2022   History of miscarriage, currently pregnant 06/14/2021   Overweight BMI=27.3 12/21/2020   Gall stones 03/22/2014    Discharge Diagnosis: Term Pregnancy Delivered      Augmentation: N/A Complications: None Intrapartum complications/course: see op note Delivery Type: primary cesarean section, low transverse incision Anesthesia: epidural anesthesia Placenta: spontaneous To Pathology: No  Laceration: n/a Episiotomy: none Newborn Data: Live born female  Birth Weight: 7 lb 7.6 oz (3390 g) APGAR: 9, 9  Newborn Delivery   Birth date/time: 03/31/2023 11:08:00 Delivery type: C-Section, Low Transverse C-section categorization: Primary      Postpartum Procedures: none [place]:      No data to display           Post partum course:  Patient had an uncomplicated postpartum course.  By time of discharge on POD#2, her pain was controlled on oral pain medications; she had appropriate lochia and was ambulating, voiding without difficulty,  tolerating regular diet and passing flatus.   She was deemed stable for discharge to home.    Discharge Physical Exam:  BP 99/65 (BP Location: Right Arm)   Pulse 85   Temp 98.3 F (36.8 C) (Oral)   Resp 20   Ht 4\' 11"  (1.499 m)   Wt 71.7 kg   LMP 06/05/2022 (Approximate)   SpO2 98%   Breastfeeding Unknown   BMI 31.91 kg/m   General: NAD CV: RRR Pulm: nl effort ABD: s/nd/nt, fundus firm and below the umbilicus Lochia: moderate Perineum:minimal edema/intact Incision: c/d/I, covered with occlusive OP site dressing  DVT Evaluation: LE non-ttp, no evidence of DVT on exam.  Hemoglobin  Date Value Ref Range Status  04/01/2023 10.0 (L) 12.0 - 15.0 g/dL Final  29/56/2130 86.5 11.1 - 15.9 g/dL Final   HCT  Date Value Ref Range Status  04/01/2023 29.4 (L) 36.0 - 46.0 % Final   Hematocrit  Date Value Ref Range Status  10/09/2022 38.1 34.0 - 46.6 % Final    Risk assessment for postpartum VTE and prophylactic treatment: Very high risk factors: None High risk factors: None Moderate risk factors: BMI 30-40 kg/m2  Postpartum VTE prophylaxis with LMWH not indicated  Disposition: stable, discharge to home. Baby Feeding: formula feeding Baby Disposition: home with mom  Rh Immune globulin indicated: No Rubella vaccine given: was not indicated Varivax vaccine given: was not indicated Flu vaccine given in AP setting: n/a Tdap vaccine given in AP setting: Yes   Contraception:  Nexplanon  Prenatal Labs:  Blood type/Rh O pos  Antibody screen neg  Rubella Immune  Varicella Immune  RPR NR  HBsAg Neg  HIV NR  GC neg  Chlamydia neg  Genetic screening negative  1 hour GTT 156  3 hour GTT 76, 146, 155, 104  GBS negative    Plan:  [name] was discharged to home in good condition.   Discharge Medications: Allergies as of 04/02/2023   No Known Allergies      Medication List     TAKE these medications    acetaminophen 500 MG tablet Commonly  known as: TYLENOL Take 1 tablet (500 mg total) by mouth every 6 (six) hours as needed.   ibuprofen 600 MG tablet Commonly known as: ADVIL Take 1 tablet (600 mg total) by mouth every 6 (six) hours as needed.   oxyCODONE 5 MG immediate release tablet Commonly known as: Oxy IR/ROXICODONE Take 1 tablet (5 mg total) by mouth every 6 (six) hours as needed for severe pain.   prenatal multivitamin Tabs tablet Take 1 tablet by mouth daily at 12 noon.         Follow-up Information     Schermerhorn, Ihor Austin, MD Follow up in 2 week(s).   Specialty: Obstetrics and Gynecology Why: for a post op appointment Contact information: [address] [place] [address] [phone]                 Signed: Cyril Mourning 04/02/2023 9:10 AM

## 2023-04-03 ENCOUNTER — Inpatient Hospital Stay
Admission: EM | Admit: 2023-04-03 | Discharge: 2023-04-05 | DRG: 776 | Disposition: A | Discharge status: Home / Self Care | Payer: Self-pay

## 2023-04-03 ENCOUNTER — Emergency Department: Payer: Self-pay

## 2023-04-03 ENCOUNTER — Other Ambulatory Visit: Payer: Self-pay

## 2023-04-03 DIAGNOSIS — O8612 Endometritis following delivery: Principal | ICD-10-CM | POA: Diagnosis present

## 2023-04-03 DIAGNOSIS — N719 Inflammatory disease of uterus, unspecified: Secondary | ICD-10-CM

## 2023-04-03 DIAGNOSIS — G8918 Other acute postprocedural pain: Principal | ICD-10-CM

## 2023-04-03 DIAGNOSIS — O864 Pyrexia of unknown origin following delivery: Secondary | ICD-10-CM | POA: Diagnosis present

## 2023-04-03 DIAGNOSIS — O321XX0 Maternal care for breech presentation, not applicable or unspecified: Secondary | ICD-10-CM | POA: Diagnosis present

## 2023-04-03 LAB — URINALYSIS, ROUTINE W REFLEX MICROSCOPIC
Bacteria, UA: NONE SEEN
Bilirubin Urine: NEGATIVE
Glucose, UA: NEGATIVE mg/dL
Ketones, ur: 20 mg/dL — AB
Nitrite: NEGATIVE
Protein, ur: NEGATIVE mg/dL
Specific Gravity, Urine: 1.011 (ref 1.005–1.030)
pH: 7 (ref 5.0–8.0)

## 2023-04-03 LAB — COMPREHENSIVE METABOLIC PANEL
ALT: 37 U/L (ref 0–44)
AST: 27 U/L (ref 15–41)
Albumin: 3.1 g/dL — ABNORMAL LOW (ref 3.5–5.0)
Alkaline Phosphatase: 146 U/L — ABNORMAL HIGH (ref 38–126)
Anion gap: 12 (ref 5–15)
BUN: 11 mg/dL (ref 6–20)
CO2: 22 mmol/L (ref 22–32)
Calcium: 9.2 mg/dL (ref 8.9–10.3)
Chloride: 102 mmol/L (ref 98–111)
Creatinine, Ser: 0.52 mg/dL (ref 0.44–1.00)
GFR, Estimated: >60 mL/min (ref >60)
Glucose, Bld: 149 mg/dL — ABNORMAL HIGH (ref 70–99)
Potassium: 3 mmol/L — ABNORMAL LOW (ref 3.5–5.1)
Sodium: 136 mmol/L (ref 135–145)
Total Bilirubin: 0.7 mg/dL (ref 0.3–1.2)
Total Protein: 7 g/dL (ref 6.5–8.1)

## 2023-04-03 LAB — CBC WITH DIFFERENTIAL/PLATELET
Abs Immature Granulocytes: 0.11 10*3/uL — ABNORMAL HIGH (ref 0.00–0.07)
Basophils Absolute: 0 10*3/uL (ref 0.0–0.1)
Basophils Relative: 0 %
Eosinophils Absolute: 0.3 10*3/uL (ref 0.0–0.5)
Eosinophils Relative: 2 %
HCT: 36.4 % (ref 36.0–46.0)
Hemoglobin: 12 g/dL (ref 12.0–15.0)
Immature Granulocytes: 1 %
Lymphocytes Relative: 8 %
Lymphs Abs: 1.1 10*3/uL (ref 0.7–4.0)
MCH: 29.1 pg (ref 26.0–34.0)
MCHC: 33 g/dL (ref 30.0–36.0)
MCV: 88.1 fL (ref 80.0–100.0)
Monocytes Absolute: 0.4 10*3/uL (ref 0.1–1.0)
Monocytes Relative: 3 %
Neutro Abs: 13 10*3/uL — ABNORMAL HIGH (ref 1.7–7.7)
Neutrophils Relative %: 86 %
Platelets: 416 10*3/uL — ABNORMAL HIGH (ref 150–400)
RBC: 4.13 MIL/uL (ref 3.87–5.11)
RDW: 13.2 % (ref 11.5–15.5)
WBC: 15 10*3/uL — ABNORMAL HIGH (ref 4.0–10.5)
nRBC: 0 % (ref 0.0–0.2)

## 2023-04-03 LAB — LACTIC ACID, PLASMA
Lactic Acid, Venous: 1.4 mmol/L (ref 0.5–1.9)
Lactic Acid, Venous: 1.3 mmol/L (ref 0.5–1.9)

## 2023-04-03 MED ORDER — ACETAMINOPHEN 500 MG PO TABS
1000.0000 mg | ORAL_TABLET | Freq: Four times a day (QID) | ORAL | Status: DC
  Administered 2023-04-04 – 2023-04-05 (×5): 1000 mg via ORAL
  Filled 2023-04-03 (×5): qty 2

## 2023-04-03 MED ORDER — COCONUT OIL OIL: 1.0000 | TOPICAL_OIL | Status: DC | PRN

## 2023-04-03 MED ORDER — CLINDAMYCIN PHOSPHATE 900 MG/50ML IV SOLN
900.0000 mg | Freq: Three times a day (TID) | INTRAVENOUS | Status: DC
  Administered 2023-04-03 – 2023-04-05 (×5): 900 mg via INTRAVENOUS
  Filled 2023-04-03 (×6): qty 50

## 2023-04-03 MED ORDER — OXYCODONE HCL 5 MG PO TABS: 5.0000 mg | ORAL_TABLET | Freq: Four times a day (QID) | ORAL | Status: DC | PRN

## 2023-04-03 MED ORDER — DIBUCAINE (PERIANAL) 1 % EX OINT: 1.0000 | TOPICAL_OINTMENT | CUTANEOUS | Status: DC | PRN

## 2023-04-03 MED ORDER — DOCUSATE SODIUM 100 MG PO CAPS: 100.0000 mg | ORAL_CAPSULE | Freq: Two times a day (BID) | ORAL | Status: DC | PRN

## 2023-04-03 MED ORDER — ACETAMINOPHEN 500 MG PO TABS
1000.0000 mg | ORAL_TABLET | Freq: Once | ORAL | Status: AC
  Administered 2023-04-03: 1000 mg via ORAL
  Filled 2023-04-03: qty 2

## 2023-04-03 MED ORDER — ONDANSETRON HCL 4 MG PO TABS: 4.0000 mg | ORAL_TABLET | ORAL | Status: DC | PRN

## 2023-04-03 MED ORDER — IBUPROFEN 600 MG PO TABS: 600.0000 mg | ORAL_TABLET | Freq: Four times a day (QID) | ORAL | Status: DC | PRN

## 2023-04-03 MED ORDER — SIMETHICONE 80 MG PO CHEW: 80.0000 mg | CHEWABLE_TABLET | ORAL | Status: DC | PRN

## 2023-04-03 MED ORDER — GENTAMICIN SULFATE 40 MG/ML IJ SOLN
5.0000 mg/kg | INTRAVENOUS | Status: DC
  Administered 2023-04-04 (×2): 330 mg via INTRAVENOUS
  Filled 2023-04-03 (×2): qty 8.25

## 2023-04-03 MED ORDER — IBUPROFEN 600 MG PO TABS
600.0000 mg | ORAL_TABLET | Freq: Four times a day (QID) | ORAL | Status: DC
  Administered 2023-04-03 – 2023-04-05 (×6): 600 mg via ORAL
  Filled 2023-04-03 (×6): qty 1

## 2023-04-03 MED ORDER — PRENATAL MULTIVITAMIN CH
1.0000 | ORAL_TABLET | Freq: Every day | ORAL | Status: DC
  Administered 2023-04-04 – 2023-04-05 (×2): 1 via ORAL
  Filled 2023-04-03 (×2): qty 1

## 2023-04-03 MED ORDER — ONDANSETRON HCL 4 MG/2ML IJ SOLN: 4.0000 mg | INTRAMUSCULAR | Status: DC | PRN

## 2023-04-03 MED ORDER — DIPHENHYDRAMINE HCL 25 MG PO CAPS: 25.0000 mg | ORAL_CAPSULE | Freq: Four times a day (QID) | ORAL | Status: DC | PRN

## 2023-04-03 MED ORDER — POTASSIUM CHLORIDE IN NACL 20-0.9 MEQ/L-% IV SOLN
INTRAVENOUS | Status: DC
  Filled 2023-04-03 (×8): qty 1000

## 2023-04-03 MED ORDER — WITCH HAZEL-GLYCERIN EX PADS: 1.0000 | MEDICATED_PAD | CUTANEOUS | Status: DC | PRN

## 2023-04-03 MED ORDER — SODIUM CHLORIDE 0.9 % IV BOLUS
1000.0000 mL | Freq: Once | INTRAVENOUS | Status: AC
  Administered 2023-04-03: 1000 mL via INTRAVENOUS

## 2023-04-03 NOTE — ED Notes (Signed)
Breast pump at bedside. 

## 2023-04-03 NOTE — Discharge Instructions (Addendum)
Call the office to schedule an appointment for 3 days for follow up after discharge from hospital.  If you have any questions or concerns you should call the on call provider after hours or the office when open.  If you have urgent concerns go to the nearest emergency department for evaluation.

## 2023-04-03 NOTE — ED Notes (Signed)
Pt has a clean and ready bed assigned at this time, called the unit and they are unavailable to take report at this time and asked to give 45 mins before bringing the pt up. This RN notified Teacher, early years/pre. Tech is going to retrieve a breast pump from the unit to bring to the pt.

## 2023-04-03 NOTE — ED Notes (Signed)
Report received, this RN now assuming care.  

## 2023-04-03 NOTE — ED Triage Notes (Signed)
Pt to ED via POV c/o post op complication. Pt had csection 3 days ago. Pt now reporting pain at incsion site that started today. Pt has taken tylenol and ibuprofen with no relief. States pain is sharp in nature. Denies CP/SOB

## 2023-04-03 NOTE — ED Provider Notes (Signed)
[facility] Provider Note  Patient Contact: 9:21 PM (approximate)   History   Post-op Problem   HPI  [name] is a 27 y.o. female who presents the emergency department complaining of lower abdominal pain radiating into the back after C-section 2 days ago.  Patient states that she had an uncomplicated C-section.  Went home and today started to develop increasing pain in her lower abdomen.  She states that she has felt chilled but has had no fevers.  No nausea, vomiting, diarrhea, constipation.  Patient denies any dysuria, polyuria, hematuria.  Denies any discharge bleeding.  Patient has dressing over her surgical site but denies any drainage or erythema around her surgical site.     Physical Exam   Triage Vital Signs: ED Triage Vitals  Enc Vitals Group     BP 04/03/23 1932 (!) 126/91     Pulse Rate 04/03/23 1932 (!) 125     Resp 04/03/23 1932 (!) 22     Temp 04/03/23 1932 99.2 F (37.3 C)     Temp Source 04/03/23 1932 Oral     SpO2 04/03/23 1932 98 %     Weight 04/03/23 1933 145 lb (65.8 kg)     Height 04/03/23 1933 4\' 11"  (1.499 m)     Head Circumference --      Peak Flow --      Pain Score 04/03/23 1932 10     Pain Loc --      Pain Edu? --      Excl. in GC? --     Most recent vital signs: Vitals:   04/03/23 1932 04/03/23 2242  BP: (!) 126/91 125/76  Pulse: (!) 125 (!) 125  Resp: (!) 22 18  Temp: 99.2 F (37.3 C) (!) 102.5 F (39.2 C)  SpO2: 98% 100%     General: Alert and in no acute distress.   Cardiovascular:  Good peripheral perfusion Respiratory: Normal respiratory effort without tachypnea or retractions. Lungs CTAB. Good air entry to the bases with no decreased or absent breath sounds. Gastrointestinal: C-section surgical site visualized across the abdominal wall.  There is no surrounding erythema, edema.  No bloody drainage.  No drainage with palpation from the site itself.  Bowel sounds 4 quadrants.  Patient  with tenderness diffusely across the lower abdomen/suprapubic region..  Patient is guarding in the lower abdomen with no rigidity.. No palpable masses. No distention. No CVA tenderness. Genitourinary: External lesions or chancres identified.  Pelvic exam reveals mild amount of blood in the vaginal vault, not atypical for recent delivery.  No other concerning findings on pelvic exam. Musculoskeletal: Full range of motion to all extremities.  Neurologic:  No gross focal neurologic deficits are appreciated.  Skin:   No rash noted Other:   ED Results / Procedures / Treatments   Labs (all labs ordered are listed, but only abnormal results are displayed) Labs Reviewed  COMPREHENSIVE METABOLIC PANEL - Abnormal; Notable for the following components:      Result Value   Potassium 3.0 (*)    Glucose, Bld 149 (*)    Albumin 3.1 (*)    Alkaline Phosphatase 146 (*)    All other components within normal limits  CBC WITH DIFFERENTIAL/PLATELET - Abnormal; Notable for the following components:   WBC 15.0 (*)    Platelets 416 (*)    Neutro Abs 13.0 (*)    Abs Immature Granulocytes 0.11 (*)    All other components within normal limits  CULTURE,  BLOOD (ROUTINE X 2)  CULTURE, BLOOD (ROUTINE X 2)  URINE CULTURE  LACTIC ACID, PLASMA  URINALYSIS, ROUTINE W REFLEX MICROSCOPIC  LACTIC ACID, PLASMA  LACTIC ACID, PLASMA  POC URINE PREG, ED     EKG     RADIOLOGY  I personally viewed, evaluated, and interpreted these images as part of my medical decision making, as well as reviewing the written report by the radiologist.  ED Provider Interpretation: Endometrial thickness of 26 mm.  Nonspecific could be secondary to retained products of conception.  US PELVIS (TRANSABDOMINAL ONLY)  Result Date: 04/03/2023 CLINICAL DATA:  Pelvic pain post cysts air in section EXAM: TRANSABDOMINAL ULTRASOUND OF PELVIS TECHNIQUE: Transabdominal ultrasound examination of the pelvis was performed including evaluation  of the uterus, ovaries, adnexal regions, and pelvic cul-de-sac. COMPARISON:  None Available. FINDINGS: Uterus Measurements: 14.3 x 8.2 x 10.5 cm = volume: 650.7 mL. No fibroids or other mass visualized. Endometrium Thickness: 26 mm.  No focal abnormality visualized. Right ovary Measurements: 3.7 x 2.7 x 2.5 cm = volume: 13.1 mL. Normal appearance/no adnexal mass. Left ovary Measurements: 3.6 x 1.9 x 2 cm = volume: 7.1 mL. Normal appearance/no adnexal mass. Other findings:  No abnormal free fluid. IMPRESSION: Endometrial thickness of 26 mm without increased vascularity. This is nonspecific and could be secondary to endometrial blood products, however hypovascular retained products of conception could also produce this appearance. Short-term follow-up pelvic ultrasound versus MRI with and without contrast could be obtained as clinically warranted. Electronically Signed   By: Jasmine Pang M.D.   On: 04/03/2023 20:56    PROCEDURES:  Critical Care performed: No  Procedures   MEDICATIONS ORDERED IN ED: Medications  acetaminophen (TYLENOL) tablet 1,000 mg (has no administration in time range)  sodium chloride 0.9 % bolus 1,000 mL (has no administration in time range)     IMPRESSION / MDM / ASSESSMENT AND PLAN / ED COURSE  I reviewed the triage vital signs and the nursing notes.                                 Differential diagnosis includes, but is not limited to, incision site dehiscence, wound infection, retained products of conception, endometritis   Patient's presentation is most consistent with acute presentation with potential threat to life or bodily function.   Patient's diagnosis is consistent with endometritis, patient presents emergency department with lower pelvic pain after C-section.  C-section 3 days ago without complication.  No complaints of drainage from the incisional site, dehiscence of the surgical site.  Surgical site is well-appearing on physical exam.  Diffuse tenderness  in the lower abdomen but not significantly more than what would be expected post C-section.  Pelvic exam reveals small amount of blood in the vault without significant hemorrhage.  Patient has some mild cervical motion tenderness but not unexpected given recent C-section.  No fevers, chills at home.  To 99.2 on arrival.  Patient initially had a pulse of 125 and respirations of 22 on arrival, however her pain improved without medications in the room her heart rate was in the upper 80s and respirations of 16.  I reached out to nurse midwife for recommendations for close follow-up versus admission for D&C.  Feel that close follow-up at this time would be appropriate.   Initially patient waslikely able to be discharged and follow-up closely with OBGYN, However she developed a fever to 102.5 while here and at this  time we will admit to the [facility] service for IV antibiotics.   FINAL CLINICAL IMPRESSION(S) / ED DIAGNOSES   Final diagnoses:  Post-operative pain  Endometritis     Rx / DC Orders   ED Discharge Orders     None        Note:  This document was prepared using Dragon voice recognition software and may include unintentional dictation errors.   Lanette Hampshire 04/03/23 2248    Pilar Jarvis, MD 04/04/23 [phone]

## 2023-04-04 DIAGNOSIS — O321XX0 Maternal care for breech presentation, not applicable or unspecified: Secondary | ICD-10-CM | POA: Diagnosis present

## 2023-04-04 LAB — CHLAMYDIA/NGC RT PCR (ARMC ONLY)
Chlamydia Tr: NOT DETECTED
N gonorrhoeae: NOT DETECTED

## 2023-04-04 LAB — CULTURE, BLOOD (ROUTINE X 2)
Culture: NO GROWTH
Special Requests: ADEQUATE

## 2023-04-04 LAB — CBC
HCT: 28.6 % — ABNORMAL LOW (ref 36.0–46.0)
Hemoglobin: 9.7 g/dL — ABNORMAL LOW (ref 12.0–15.0)
MCH: 29.5 pg (ref 26.0–34.0)
MCHC: 33.9 g/dL (ref 30.0–36.0)
MCV: 86.9 fL (ref 80.0–100.0)
Platelets: 335 10*3/uL (ref 150–400)
RBC: 3.29 MIL/uL — ABNORMAL LOW (ref 3.87–5.11)
RDW: 13.2 % (ref 11.5–15.5)
WBC: 14.5 10*3/uL — ABNORMAL HIGH (ref 4.0–10.5)
nRBC: 0 % (ref 0.0–0.2)

## 2023-04-04 LAB — COMPREHENSIVE METABOLIC PANEL
ALT: 30 U/L (ref 0–44)
AST: 15 U/L (ref 15–41)
Albumin: 2.3 g/dL — ABNORMAL LOW (ref 3.5–5.0)
Alkaline Phosphatase: 112 U/L (ref 38–126)
Anion gap: 6 (ref 5–15)
BUN: 9 mg/dL (ref 6–20)
CO2: 21 mmol/L — ABNORMAL LOW (ref 22–32)
Calcium: 7.8 mg/dL — ABNORMAL LOW (ref 8.9–10.3)
Chloride: 110 mmol/L (ref 98–111)
Creatinine, Ser: 0.43 mg/dL — ABNORMAL LOW (ref 0.44–1.00)
GFR, Estimated: >60 mL/min (ref >60)
Glucose, Bld: 109 mg/dL — ABNORMAL HIGH (ref 70–99)
Potassium: 3.3 mmol/L — ABNORMAL LOW (ref 3.5–5.1)
Sodium: 137 mmol/L (ref 135–145)
Total Bilirubin: 0.7 mg/dL (ref 0.3–1.2)
Total Protein: 5.4 g/dL — ABNORMAL LOW (ref 6.5–8.1)

## 2023-04-04 LAB — AEROBIC/ANAEROBIC CULTURE W GRAM STAIN (SURGICAL/DEEP WOUND)

## 2023-04-04 LAB — LACTIC ACID, PLASMA: Lactic Acid, Venous: 0.8 mmol/L (ref 0.5–1.9)

## 2023-04-04 NOTE — ED Notes (Signed)
Report given to Terri RN.

## 2023-04-04 NOTE — Lactation Note (Signed)
Lactation Consultation Note  Patient Name: [name] Today's Date: 04/04/2023 Age:27 y.o.  Readmit for ? endometritus   Maternal Data  PP Day #4, mom states she is pumping her breasts when they feel full, she pumps approx 60- 70 cc total from both breasts. She is bottlefeeding her baby her EBM and formula.  I encouraged her to pump her breasts 8x/24 hr or q3hrs to assist in increasing milk supply and transition to mature milk, especially since she has a hx of low supply with her first child.  Feeding   Baby is present in room, mom is bottlefeeding formula and EBM LATCH Score                    Lactation Tools Discussed/Used  She has a Medela breast pump at home for home use, given breastmilk storage magnet  Interventions  LC name and no written on white board  Discharge    Consult Status  prn    Dyann Kief 04/04/2023, 1:05 PM

## 2023-04-04 NOTE — Progress Notes (Signed)
Patient ID: [name], female   DOB: 01-23-1996, 27 y.o.   MRN: [id number] Agree with admission and abx choice . Will continue  until 24-48 hrs afebrile

## 2023-04-04 NOTE — Progress Notes (Signed)
Post Partum Day 4 Postpartum readmit for postpartum endometritis  Subjective: Doing well, denies feeling feverish, states she is feeling better than she did this morning.  Tolerating regular diet, pain with PO meds, voiding and ambulating without difficulty.  Her only complaint is abdominal pain with palpation.  No CP SOB Fever,Chills, N/V or leg pain; denies nipple or breast pain, no HA change of vision, RUQ/epigastric pain  Objective: BP 102/71 (BP Location: Left Arm)   Pulse 80   Temp 98 F (36.7 C) (Oral)   Resp 20   Ht 4\' 11"  (1.499 m)   Wt 65.8 kg   SpO2 99%   Breastfeeding No   BMI 29.29 kg/m    Physical Exam:  General: NAD Breasts: soft/nontender, CV: RRR Pulm: nl effort, CTABL Abdomen: soft, tender, BS x 4 Incision: Dsg CDI/Steristrips intact/no erythema or drainage Perineum: minimal edema, intact Lochia: moderate, foul odor Uterine Fundus: fundus firm and 1 fb below umbilicus DVT Evaluation: no cords, ttp LEs   Recent Labs    04/03/23 1935 04/04/23 0613  HGB 12.0 9.7*  HCT 36.4 28.6*  WBC 15.0* 14.5*  PLT 416* 335    Assessment/Plan: 27 y.o. [id number] postpartum day # 4  - Continue routine PP care - Lactation consult PRN, pump and feed infant to avoid mastitis - Postpartum endometritis: afebrile since around 0100, receiving Gentamycin and Clindamycin scheduled. She is aware we recommend she remain inpatient for 24hrs afebrile, so the earliest she would discharge would be tomorrow.   Disposition: Does not desire Dc home today.   Janyce Llanos, CNM 04/04/2023 10:57 AM

## 2023-04-04 NOTE — H&P (Signed)
History of Present Illness:   Chief Complaint: abdominal pain  HPI:  [name] is a 27 y.o. [id number] female at 4 days postpartum who presented to the ED with complaints of abdominal pain that radiates to the back.  She had an uncomplicated Caesarean section for breech presentation on 03/31/2023 and was discharged home in stable condition on 04/02/2023.  She reports the following symptoms of lower abdominal pain, lower back pain, and fever.  She denies dysuria or change in vaginal discharge.  Lochia appropriate for PPD 4. She initially had a temperature of 99.2 with pulse of 125 on arrival to ED.  Pulse decreased to 80 bpm with no intervention.  Abdominal assessment was overall benign and appropriate for postoperative state.  Plan was made to discharge home with close follow up outpatient.  She reported feeling worse prior to discharge.  On reassessment she had a fever of 102.5 and significantly more uterine tenderness.  Decision made to admit for postpartum fever.  High suspicion for postpartum endometritis but complicated cystitis in differential.     Patient Active Problem List   Diagnosis Date Noted   Delivery by cesarean section for breech presentation 04/04/2023   Postpartum fever 04/03/2023   Status post primary low transverse cesarean section 04/02/2023   Abnormal glucose tolerance test during pregnancy, antepartum 02/09/2023   Missed prenatal care x ~10 weeks: noncompliant with prenatal care 02/06/2023   Chlamydia Infection  10/21/22 10/15/2022   Supervision of other normal pregnancy, antepartum 10/09/2022   History of miscarriage, currently pregnant 06/14/2021   Overweight BMI=27.3 12/21/2020   Gall stones 03/22/2014     Maternal Medical History:   Past Medical History:  Diagnosis Date   Anemia    during 2nd pregnancy    Past Surgical History:  Procedure Laterality Date   CESAREAN SECTION  03/31/2023   Procedure: CESAREAN SECTION;  Surgeon: Feliberto Gottron,  Ihor Austin, MD;  Location: [facility] ORS;  Service: Obstetrics;;   CHOLECYSTECTOMY  05/2014   DILATION AND EVACUATION N/A 06/26/2021   Procedure: DILATATION AND EVACUATION;  Surgeon: Christeen Douglas, MD;  Location: [facility] ORS;  Service: Gynecology;  Laterality: N/A;    No Known Allergies  Prior to Admission medications   Medication Sig Start Date End Date Taking? Authorizing Provider  acetaminophen (TYLENOL) 500 MG tablet Take 1 tablet (500 mg total) by mouth every 6 (six) hours as needed. 04/02/23   Haroldine Laws, CNM  ibuprofen (ADVIL) 600 MG tablet Take 1 tablet (600 mg total) by mouth every 6 (six) hours as needed. 04/02/23   Haroldine Laws, CNM  oxyCODONE (OXY IR/ROXICODONE) 5 MG immediate release tablet Take 1 tablet (5 mg total) by mouth every 6 (six) hours as needed for severe pain. 04/02/23   Haroldine Laws, CNM  Prenatal Vit-Fe Fumarate-FA (PRENATAL MULTIVITAMIN) TABS tablet Take 1 tablet by mouth daily at 12 noon. 12/04/22   Lenice Llamas, FNP     Prenatal care site:  [facility]  OB History  Gravida Para Term Preterm AB Living  5 4 4  0 1 4  SAB IAB Ectopic Multiple Live Births  1 0 0 0 4    # Outcome Date GA Lbr Len/2nd Weight Sex Delivery Anes PTL Lv  5 Term 03/31/23 [redacted]w[redacted]d  3390 g F CS-LTranv EPI  LIV     Name: [name],PENDINGBABY     Apgar1: 9  Apgar5: 9  4 SAB 06/26/21 [redacted]w[redacted]d         3 Term 10/24/16 [redacted]w[redacted]d / 00:13 3130 g M Vag-Spont  EPI  LIV     Name: VILLAMAR,BOY [name]     Apgar1: 9  Apgar5: 9  2 Term 10/24/15 [redacted]w[redacted]d  3147 g F Vag-Spont   LIV  1 Term 04/29/14 [redacted]w[redacted]d  2693 g F Vag-Spont   LIV    Obstetric Comments  1st Menstrual Cycle:  12   1st Pregnancy: 17       Social History: She  reports that she has never smoked. She has never been exposed to tobacco smoke. She has never used smokeless tobacco. She reports that she does not currently use alcohol. She reports that she does not use drugs.  Family History: family history includes Healthy in her brother,  daughter, daughter, father, maternal grandfather, maternal grandmother, mother, paternal grandfather, paternal grandmother, sister, sister, sister, sister, and son.   Review of Systems: A full review of systems was performed and negative except as noted in the HPI.     Physical Exam:  Vital Signs: BP 101/67 (BP Location: Left Arm)   Pulse 89   Temp 98.5 F (36.9 C) (Oral)   Resp 18   Ht 4\' 11"  (1.499 m)   Wt 65.8 kg   SpO2 99%   Breastfeeding No   BMI 29.29 kg/m  Physical Exam  General: no acute distress.  HEENT: normocephalic, atraumatic Lungs: normal respiratory effort Abdomen: soft, gravid, tender; fundus firm, U/3 Incision: healing well, no significant drainage, covered with occlusive OP site  Pelvic: lochia appropriate for POD4, no discharge or purulent drainage  Extremities: non-tender, symmetric, no edema bilaterally.  DTRs: 2+/2+  Neurologic: Alert & oriented x 3.    Results for orders placed or performed during the hospital encounter of 04/03/23 (from the past 24 hour(s))  Lactic acid, plasma     Status: None   Collection Time: 04/03/23  7:35 PM  Result Value Ref Range   Lactic Acid, Venous 1.4 0.5 - 1.9 mmol/L  Comprehensive metabolic panel     Status: Abnormal   Collection Time: 04/03/23  7:35 PM  Result Value Ref Range   Sodium 136 135 - 145 mmol/L   Potassium 3.0 (L) 3.5 - 5.1 mmol/L   Chloride 102 98 - 111 mmol/L   CO2 22 22 - 32 mmol/L   Glucose, Bld 149 (H) 70 - 99 mg/dL   BUN 11 6 - 20 mg/dL   Creatinine, Ser 1.61 0.44 - 1.00 mg/dL   Calcium 9.2 8.9 - 09.6 mg/dL   Total Protein 7.0 6.5 - 8.1 g/dL   Albumin 3.1 (L) 3.5 - 5.0 g/dL   AST 27 15 - 41 U/L   ALT 37 0 - 44 U/L   Alkaline Phosphatase 146 (H) 38 - 126 U/L   Total Bilirubin 0.7 0.3 - 1.2 mg/dL   GFR, Estimated >04 >54 mL/min   Anion gap 12 5 - 15  CBC with Differential     Status: Abnormal   Collection Time: 04/03/23  7:35 PM  Result Value Ref Range   WBC 15.0 (H) 4.0 - 10.5 K/uL   RBC  4.13 3.87 - 5.11 MIL/uL   Hemoglobin 12.0 12.0 - 15.0 g/dL   HCT 09.8 11.9 - 14.7 %   MCV 88.1 80.0 - 100.0 fL   MCH 29.1 26.0 - 34.0 pg   MCHC 33.0 30.0 - 36.0 g/dL   RDW 82.9 56.2 - 13.0 %   Platelets 416 (H) 150 - 400 K/uL   nRBC 0.0 0.0 - 0.2 %   Neutrophils Relative % 86 %  Neutro Abs 13.0 (H) 1.7 - 7.7 K/uL   Lymphocytes Relative 8 %   Lymphs Abs 1.1 0.7 - 4.0 K/uL   Monocytes Relative 3 %   Monocytes Absolute 0.4 0.1 - 1.0 K/uL   Eosinophils Relative 2 %   Eosinophils Absolute 0.3 0.0 - 0.5 K/uL   Basophils Relative 0 %   Basophils Absolute 0.0 0.0 - 0.1 K/uL   Immature Granulocytes 1 %   Abs Immature Granulocytes 0.11 (H) 0.00 - 0.07 K/uL  Lactic acid, plasma     Status: None   Collection Time: 04/03/23 10:57 PM  Result Value Ref Range   Lactic Acid, Venous 1.3 0.5 - 1.9 mmol/L  Urinalysis, Routine w reflex microscopic -Urine, Clean Catch     Status: Abnormal   Collection Time: 04/03/23 11:20 PM  Result Value Ref Range   Color, Urine YELLOW (A) YELLOW   APPearance CLEAR (A) CLEAR   Specific Gravity, Urine 1.011 1.005 - 1.030   pH 7.0 5.0 - 8.0   Glucose, UA NEGATIVE NEGATIVE mg/dL   Hgb urine dipstick LARGE (A) NEGATIVE   Bilirubin Urine NEGATIVE NEGATIVE   Ketones, ur 20 (A) NEGATIVE mg/dL   Protein, ur NEGATIVE NEGATIVE mg/dL   Nitrite NEGATIVE NEGATIVE   Leukocytes,Ua MODERATE (A) NEGATIVE   RBC / HPF 6-10 0 - 5 RBC/hpf   WBC, UA 6-10 0 - 5 WBC/hpf   Bacteria, UA NONE SEEN NONE SEEN   Squamous Epithelial / HPF 0-5 0 - 5 /HPF   Mucus PRESENT   Lactic acid, plasma     Status: None   Collection Time: 04/04/23  1:49 AM  Result Value Ref Range   Lactic Acid, Venous 0.8 0.5 - 1.9 mmol/L    Pertinent Results:  CBC    Component Value Date/Time   WBC 15.0 (H) 04/03/2023 1935   RBC 4.13 04/03/2023 1935   HGB 12.0 04/03/2023 1935   HGB 12.8 10/09/2022 1535   HCT 36.4 04/03/2023 1935   HCT 38.1 10/09/2022 1535   PLT 416 (H) 04/03/2023 1935   PLT 413  10/09/2022 1535   MCV 88.1 04/03/2023 1935   MCV 89 10/09/2022 1535   MCV 81 06/09/2014 0454   MCH 29.1 04/03/2023 1935   MCHC 33.0 04/03/2023 1935   RDW 13.2 04/03/2023 1935   RDW 13.1 10/09/2022 1535   RDW 17.6 (H) 06/09/2014 0454   LYMPHSABS 1.1 04/03/2023 1935   LYMPHSABS 2.1 10/09/2022 1535   LYMPHSABS 1.7 06/09/2014 0454   MONOABS 0.4 04/03/2023 1935   MONOABS 1.1 (H) 06/09/2014 0454   EOSABS 0.3 04/03/2023 1935   EOSABS 0.3 10/09/2022 1535   EOSABS 0.0 06/09/2014 0454   BASOSABS 0.0 04/03/2023 1935   BASOSABS 0.0 10/09/2022 1535   BASOSABS 0.0 06/09/2014 0454   CMP     Component Value Date/Time   NA 136 04/03/2023 1935   NA 138 06/09/2014 0454   K 3.0 (L) 04/03/2023 1935   K 3.6 06/09/2014 0454   CL 102 04/03/2023 1935   CL 107 06/09/2014 0454   CO2 22 04/03/2023 1935   CO2 25 06/09/2014 0454   GLUCOSE 149 (H) 04/03/2023 1935   GLUCOSE 108 (H) 06/09/2014 0454   BUN 11 04/03/2023 1935   BUN 3 (L) 06/09/2014 0454   CREATININE 0.52 04/03/2023 1935   CREATININE 0.68 06/09/2014 0454   CALCIUM 9.2 04/03/2023 1935   CALCIUM 8.5 (L) 06/09/2014 0454   PROT 7.0 04/03/2023 1935   PROT 6.5 06/09/2014 0454  ALBUMIN 3.1 (L) 04/03/2023 1935   ALBUMIN 3.2 (L) 06/09/2014 0454   AST 27 04/03/2023 1935   AST 268 (H) 06/09/2014 0454   ALT 37 04/03/2023 1935   ALT 252 (H) 06/09/2014 0454   ALKPHOS 146 (H) 04/03/2023 1935   ALKPHOS 109 06/09/2014 0454   BILITOT 0.7 04/03/2023 1935   BILITOT 2.1 (H) 06/09/2014 0454   GFRNONAA >60 04/03/2023 1935     US PELVIS (TRANSABDOMINAL ONLY)  Result Date: 04/03/2023 CLINICAL DATA:  Pelvic pain post cysts air in section EXAM: TRANSABDOMINAL ULTRASOUND OF PELVIS TECHNIQUE: Transabdominal ultrasound examination of the pelvis was performed including evaluation of the uterus, ovaries, adnexal regions, and pelvic cul-de-sac. COMPARISON:  None Available. FINDINGS: Uterus Measurements: 14.3 x 8.2 x 10.5 cm = volume: 650.7 mL. No fibroids or  other mass visualized. Endometrium Thickness: 26 mm.  No focal abnormality visualized. Right ovary Measurements: 3.7 x 2.7 x 2.5 cm = volume: 13.1 mL. Normal appearance/no adnexal mass. Left ovary Measurements: 3.6 x 1.9 x 2 cm = volume: 7.1 mL. Normal appearance/no adnexal mass. Other findings:  No abnormal free fluid. IMPRESSION: Endometrial thickness of 26 mm without increased vascularity. This is nonspecific and could be secondary to endometrial blood products, however hypovascular retained products of conception could also produce this appearance. Short-term follow-up pelvic ultrasound versus MRI with and without contrast could be obtained as clinically warranted. Electronically Signed   By: Jasmine Pang M.D.   On: 04/03/2023 20:56    Assessment:  [name] is a 27 y.o. [id number] female with postpartum preeclampsia with severe features.   Plan:  1. Admit to Labor & Delivery; consents reviewed and obtained - Discussed plan of care with Dr. Feliberto Gottron   2. Routine postpartum care  - Lactation consult PRN breastfeeding  - Continue routine postpartum medications  - Vital signs q 4 hours   3. Postpartum Endometritis  - Tmax 102.5 - IV fluids with potassium replacement  - Abx therapy with Gentamicin and clindamycin  - Tylenol PRN pain/fever  - UA and urine culture ordered  - Vaginal cultures collected  - CBC and CMP ordered for AM   Gustavo Lah, CNM 04/04/23 6:09 AM  Gustavo Lah, CNM Certified Nurse Midwife [facility]

## 2023-04-05 LAB — CBC
HCT: 28.5 % — ABNORMAL LOW (ref 36.0–46.0)
Hemoglobin: 9.4 g/dL — ABNORMAL LOW (ref 12.0–15.0)
MCH: 29 pg (ref 26.0–34.0)
MCHC: 33 g/dL (ref 30.0–36.0)
MCV: 88 fL (ref 80.0–100.0)
Platelets: 348 10*3/uL (ref 150–400)
RBC: 3.24 MIL/uL — ABNORMAL LOW (ref 3.87–5.11)
RDW: 13.2 % (ref 11.5–15.5)
WBC: 11.7 10*3/uL — ABNORMAL HIGH (ref 4.0–10.5)
nRBC: 0 % (ref 0.0–0.2)

## 2023-04-05 LAB — URINE CULTURE: Culture: NO GROWTH

## 2023-04-05 LAB — CULTURE, BLOOD (ROUTINE X 2)

## 2023-04-05 NOTE — Discharge Summary (Signed)
Post Partum Day 5, readmission for postpartum endometritis  Subjective: Doing well, no complaints.  States her pain is better, does not feel feverish/ Tolerating regular diet, pain with PO meds, voiding and ambulating without difficulty.  No CP SOB Fever,Chills, N/V or leg pain; denies nipple or breast pain no HA change of vision, RUQ/epigastric pain  Objective: BP 104/73 (BP Location: Left Arm)   Pulse 83   Temp 98.3 F (36.8 C) (Oral)   Resp 18   Ht 4\' 11"  (1.499 m)   Wt 65.8 kg   SpO2 99%   Breastfeeding No   BMI 29.29 kg/m    Physical Exam:  General: NAD Breasts: soft/nontender CV: RRR Pulm: nl effort, CTABL Abdomen: soft, NT, BS x 4 Incision: Dsg CDI/Steristrips intact/no erythema or drainage Lochia: moderate Uterine Fundus: fundus firm and 2 fb below umbilicus DVT Evaluation: no cords, ttp LEs   Recent Labs    04/04/23 0613 04/05/23 0553  HGB 9.7* 9.4*  HCT 28.6* 28.5*  WBC 14.5* 11.7*  PLT 335 348    Assessment/Plan: 27 y.o. [id number] postpartum day # 5, here for postpartum endometritis   - Postpartum endometritis: has been afebrile since 04/04/23 at 0100. She received Gentamicin and Clindamycin. Her pain has resolved. Discussed with Dr. Feliberto Gottron and she is stable for discharge. - Educated patient to return if symptoms return or worsen  Disposition: Dc home today.   Janyce Llanos, CNM 04/05/2023 8:49 AM

## 2023-04-05 NOTE — Progress Notes (Signed)
Discharge instructions reviewed with patient and significant other.  Printed copies given to patient for reference after discharge home.  Follow up instructions reviewed. Questions answered. Patient belongings gathered for discharge home.  Patient transported by wheelchair to medical mall for discharge home.

## 2023-04-06 ENCOUNTER — Ambulatory Visit: Payer: Self-pay

## 2023-04-06 LAB — AEROBIC/ANAEROBIC CULTURE W GRAM STAIN (SURGICAL/DEEP WOUND)

## 2023-04-06 LAB — CULTURE, BLOOD (ROUTINE X 2): Culture: NO GROWTH

## 2023-04-08 LAB — CULTURE, BLOOD (ROUTINE X 2): Special Requests: ADEQUATE

## 2023-04-08 LAB — AEROBIC/ANAEROBIC CULTURE W GRAM STAIN (SURGICAL/DEEP WOUND)

## 2023-04-28 NOTE — Addendum Note (Signed)
Addended by: Clent Damore on: 04/28/2023 01:38 PM   Modules accepted: Orders  

## 2023-04-28 NOTE — Addendum Note (Signed)
Addended by: Heywood Bene on: 04/28/2023 01:38 PM   Modules accepted: Orders

## 2023-04-30 ENCOUNTER — Telehealth: Payer: Self-pay | Admitting: Family Medicine

## 2023-04-30 NOTE — Telephone Encounter (Signed)
Pt called wanting a nexplanon as soon as possible inserted but she gave birth on May 7th and isn't due for a postpartum till the 18th of June. We don't have any availability for  PP till early July

## 2023-04-30 NOTE — Telephone Encounter (Signed)
During call, client reports no intercourse since delivery and counseled to remain abstinent until after Nexplanon placed. She verbalized understanding. Jossie Ng, RN

## 2023-04-30 NOTE — Telephone Encounter (Signed)
Client delivered via C-section 03/31/2023 at [facility]. Desires Nexplanon ASAP with post-partum exam. Post-partum / Nexplanon insertion appt scheduled for 05/12/23 with arrival time of 1000. 50 minute appt scheduled. Jossie Ng, RN

## 2023-05-07 ENCOUNTER — Telehealth: Payer: Self-pay

## 2023-05-12 ENCOUNTER — Ambulatory Visit: Payer: Self-pay

## 2023-05-12 ENCOUNTER — Telehealth: Payer: Self-pay

## 2023-05-12 NOTE — Telephone Encounter (Signed)
DNKA for post-partum and Nexplanon insertion 05/12/23. Call to client and per recorded message, not accepting calls at this time. Call to emergency contact (mother) and no answer / no voicemail. Jossie Ng, RN

## 2023-06-12 ENCOUNTER — Ambulatory Visit: Payer: Self-pay

## 2023-06-12 ENCOUNTER — Encounter: Payer: Self-pay | Admitting: Advanced Practice Midwife

## 2023-06-12 ENCOUNTER — Ambulatory Visit: Payer: Self-pay | Admitting: Advanced Practice Midwife

## 2023-06-12 DIAGNOSIS — Z30017 Encounter for initial prescription of implantable subdermal contraceptive: Secondary | ICD-10-CM

## 2023-06-12 DIAGNOSIS — O8612 Endometritis following delivery: Secondary | ICD-10-CM

## 2023-06-12 LAB — PREGNANCY, URINE: Preg Test, Ur: NEGATIVE

## 2023-06-12 LAB — HEMOGLOBIN, FINGERSTICK: Hemoglobin: 13.2 g/dL (ref 11.1–15.9)

## 2023-06-12 MED ORDER — ETONOGESTREL 68 MG ~~LOC~~ IMPL
68.0000 mg | DRUG_IMPLANT | Freq: Once | SUBCUTANEOUS | Status: AC
Start: 2023-06-12 — End: 2023-06-12
  Administered 2023-06-12: 68 mg via SUBCUTANEOUS

## 2023-06-12 NOTE — Progress Notes (Signed)
Reviewed in house lab results. BTHIELE RN

## 2023-06-12 NOTE — Progress Notes (Signed)
[facility] Department  Postpartum Exam  [name] is a 27 y.o.SHF exvaper G5P4 (9, 7, 6, baby) female who presents for a postpartum visit. She is 10 weeks postpartum following a primary cesarean section for breech on 03/31/23 F 7#7 with epidural.  I have fully reviewed the prenatal and intrapartum course. The delivery was at 39 2/7 gestational weeks.  Anesthesia: epidural. Postpartum course has been abnormal with pp endometritis and rehospitalized 04/03/23 x 3 days. Baby is doing well. Baby is feeding by bottle - [employer] Good Start. Bleeding no bleeding. Bowel function is normal. Bladder function is normal. Patient is sexually active. Contraception method is condoms. Postpartum depression screening: negative.  Bottlefeeding q 3 hours (4 oz) and baby weighed 11 lbs on 06/01/23 at peds.  Has had pp coitus x3 with condom and taking Plan B day after each sex, per pt. Last coitus 06/09/23 and 05/20/23 with condom; with current partner x 4 years. Last Plan B 06/10/23. Last dental exam age 65. Last pap 03/29/20 neg. Last vaped 2 years ago. Last ETOH 2023 (1 [place]). LMP 05/28/23. Wants Nexplanon today   The pregnancy intention screening data noted above was reviewed. Potential methods of contraception were discussed. The patient elected to proceed with Hormonal Implant.    Health Maintenance Due  Topic Date Due   HPV VACCINES (1 - 3-dose series) Never done   DTaP/Tdap/Td (1 - Tdap) Never done   COVID-19 Vaccine (1 - 2023-24 season) Never done   PAP-Cervical Cytology Screening  03/30/2023   PAP SMEAR-Modifier  03/30/2023    The following portions of the patient's history were reviewed and updated as appropriate: allergies, current medications, past family history, past medical history, past social history, past surgical history, and problem list.  Review of Systems Pertinent items are noted in HPI.  Objective:  BP 100/65   Pulse 67   Temp 97.6 F (36.4 C)   Wt 137 lb 3.2  oz (62.2 kg)   LMP 05/28/2023 (Exact Date)   Breastfeeding No   BMI 27.71 kg/m    General:  alert   Breasts:  normal  Lungs: clear to auscultation bilaterally  Heart:  regular rate and rhythm, S1, S2 normal, no murmur, click, rub or gallop  Abdomen: soft, non-tender; bowel sounds normal; no masses,  no organomegaly   Wound well approximated incision  GU exam:  normal       Assessment:      2. Postpartum exam   3. Postpartum endometritis 04/03/23 Readmitted 04/03/23 x 3 days  4. Encounter for initial prescription of implantable subdermal contraceptive Pt desires Nexplanon insertion today Nexplanon Insertion Procedure Patient identified, informed consent performed, consent signed.   Patient does understand that irregular bleeding is a very common side effect of this medication. She was advised to have backup contraception after placement. Patient was determined to meet WHO criteria for not being pregnant. Appropriate time out taken.  The insertion site was identified 8-10 cm (3-4 inches) from the medial epicondyle of the humerus and 3-5 cm (1.25-2 inches) posterior to (below) the sulcus (groove) between the biceps and triceps muscles of the patient's left arm and marked.  Patient was prepped with alcohol swab and then injected with 3 ml of 1% lidocaine.  Arm was prepped with chlorhexidene, Nexplanon removed from packaging,  Device confirmed in needle, then inserted full length of needle and withdrawn per handbook instructions. Nexplanon was able to palpated in the patient's arm; patient palpated the insert herself. There was minimal  blood loss.  Patient insertion site covered with guaze and a pressure bandage to reduce any bruising.  The patient tolerated the procedure well and was given post procedure instructions.   Nexplanon:   Counseled patient to take OTC analgesic starting as soon as lidocaine starts to wear off and take regularly for at least 48 hr to decrease discomfort.   Specifically to take with food or milk to decrease stomach upset and for IB 600 mg (3 tablets) every 6 hrs; IB 800 mg (4 tablets) every 8 hrs; or Aleve 2 tablets every 12 hrs.     10 wk postpartum exam.   Plan:   Essential components of care per ACOG recommendations:  1.  Mood and well being: Patient with negative depression screening today. Reviewed local resources for support.  - Patient tobacco use? No.   - hx of drug use? No.    2. Infant care and feeding:  -Patient currently breastmilk feeding? No.  -Social determinants of health (SDOH) reviewed in EPIC. No concernsThe following needs were identified  3. Sexuality, contraception and birth spacing - Patient does not want a pregnancy in the next year.  Desired family size is 4 children.  - Reviewed reproductive life planning. Reviewed options based on patient desire and reproductive life plan. Patient is interested in Hormonal Implant. This was provided to the patient today.  if not why not clearly documented  Risks, benefits, and typical effectiveness rates were reviewed.  Questions were answered.  Written information was also given to the patient to review.    The patient will follow up in  1 years for surveillance.  The patient was told to call with any further questions, or with any concerns about this method of contraception.  Emphasized use of condoms 100% of the time for STI prevention.  Patient was offered ECP based on > 120 hours .  Patient is within 3 days of unprotected sex. Patient was offered ECP. Reviewed options and patient desired No method of ECP, declined all    - Discussed birth spacing of 18 months  4. Sleep and fatigue -Encouraged family/partner/community support of 4 hrs of uninterrupted sleep to help with mood and fatigue  5. Physical Recovery  - Discussed patients delivery and complications. She describes her labor as bad. - Patient had a C-section. Patient had a  0  laceration. Perineal healing reviewed.  Patient expressed understanding - Patient has urinary incontinence? No. - Patient is not safe to resume physical and sexual activity  6.  Health Maintenance - HM due items addressed Yes - Last pap smear No results found for: "DIAGPAP" Pap smear done at today's visit.  -Breast Cancer screening indicated? No.   7. Chronic Disease/Pregnancy Condition follow up: None  - PCP follow up  Alberteen Spindle, CNM [facility] Department

## 2023-06-24 ENCOUNTER — Encounter: Payer: Self-pay | Admitting: Advanced Practice Midwife

## 2023-06-24 DIAGNOSIS — R87619 Unspecified abnormal cytological findings in specimens from cervix uteri: Secondary | ICD-10-CM | POA: Insufficient documentation

## 2023-06-25 ENCOUNTER — Telehealth: Payer: Self-pay

## 2023-06-25 NOTE — Telephone Encounter (Signed)
06/25/23 Colpo referral sent to Joellyn Quails with [facility] through [employer].

## 2023-06-25 NOTE — Telephone Encounter (Signed)
Phone call to pt at [phone]. Pt confirmed identity. Pt counseled re pap smear results from 06/12/23 specimen and colpo referral. LSIL, no HPV testing.  Pt confirmed she does not have any insurance, and desires the colpo referral be sent to [facility].

## 2023-06-26 ENCOUNTER — Telehealth: Payer: Self-pay | Admitting: Hematology and Oncology

## 2023-08-25 ENCOUNTER — Ambulatory Visit: Payer: Self-pay

## 2023-10-27 ENCOUNTER — Ambulatory Visit: Payer: Self-pay

## 2023-10-27 ENCOUNTER — Ambulatory Visit: Payer: Self-pay | Attending: Family | Admitting: Hematology and Oncology

## 2023-10-27 ENCOUNTER — Other Ambulatory Visit (HOSPITAL_COMMUNITY)
Admission: RE | Admit: 2023-10-27 | Discharge: 2023-10-27 | Disposition: A | Discharge status: Home / Self Care | Payer: Self-pay | Source: Ambulatory Visit | Attending: Hematology and Oncology | Admitting: Hematology and Oncology

## 2023-10-27 VITALS — BP 101/75 | Wt 157.0 lb

## 2023-10-27 DIAGNOSIS — Z01812 Encounter for preprocedural laboratory examination: Secondary | ICD-10-CM

## 2023-10-27 DIAGNOSIS — R87612 Low grade squamous intraepithelial lesion on cytologic smear of cervix (LGSIL): Secondary | ICD-10-CM

## 2023-10-27 NOTE — Progress Notes (Signed)
Ms. Austria [name]-Castillo is a 27 y.o. female who presents to [facility] clinic today with no complaints.    Pap Smear: Pap not smear completed today. Last Pap smear was 06/12/2023 at and was LSIL/ without HPV testing. Per patient has no history of an abnormal Pap smear. Last Pap smear result is available in Epic.   Physical exam: Breasts Breasts symmetrical. No skin abnormalities bilateral breasts. No nipple retraction bilateral breasts. No nipple discharge bilateral breasts. No lymphadenopathy. No lumps palpated bilateral breasts.       Pelvic/Bimanual Pap is not indicated today   GYNECOLOGY CLINIC COLPOSCOPY PROCEDURE NOTE  Ms. Austria [name]-Castillo is a 27 y.o. [id number] here for colposcopy for low-grade squamous intraepithelial neoplasia (LGSIL - encompassing HPV,mild dysplasia,CIN I) pap smear on 06/12/2023. Discussed role for HPV in cervical dysplasia, need for surveillance.  Patient given informed consent, signed copy in the chart, time out was performed.  Placed in lithotomy position. Cervix viewed with speculum and colposcope after application of acetic acid.   Colposcopy adequate? Yes  no visible lesions.  ECC specimen obtained. All specimens were labelled and sent to pathology.  Patient was given post procedure instructions.  Will follow up pathology and manage accordingly.  Routine preventative health maintenance measures emphasized.   Smoking History: Patient has never smoked and was not referred to quit line.    Patient Navigation: Patient education provided. Access to services provided for patient through [facility] program. No interpreter provided. No transportation provided   Colorectal Cancer Screening: Per patient has never had colonoscopy completed No complaints today.    Breast and Cervical Cancer Risk Assessment: Patient does not have family history of breast cancer, known genetic mutations, or radiation treatment to the chest before age 14. Patient has  history of cervical dysplasia, immunocompromised, or DES exposure in-utero.  Risk Assessment   No risk assessment data     A: [facility] exam without pap smear No complaints with colposcopy as above.   P: Will follow accordingly once pathology obtained. If normal or LSIL, will repeat Pap smear in one year. If high grade, will refer to gynecology.   Pascal Lux, NP 10/27/2023 8:57 AM

## 2023-10-29 LAB — SURGICAL PATHOLOGY

## 2023-11-09 ENCOUNTER — Telehealth: Payer: Self-pay

## 2023-11-09 NOTE — Telephone Encounter (Signed)
I have left a detailed message for pt advising of her COLPO results. I have advised pt that her results were essentially the same as her 06/12/23 PAP results, both indicating low-grade changes. I have advised pt to repeat her PAP in one year and if she has any questions or concerns, please call our office back at [phone].

## 2023-11-10 NOTE — Progress Notes (Signed)
Colpo completed by Pascal Lux, NP with [facility] on 10/27/23.  Reference surgical pathology lab result in Epic dated 10/28/23.  Copied and pasted from 11/09/23 follow-up notes:  November 09, 2023 [name], [facility]  11/09/23 10:17 AM Note I have left a detailed message for pt advising of her COLPO results. I have advised pt that her results were essentially the same as her 06/12/23 PAP results, both indicating low-grade changes. I have advised pt to repeat her PAP in one year and if she has any questions or concerns, please call our office back at [phone].

## 2024-01-21 DIAGNOSIS — D72829 Elevated white blood cell count, unspecified: Secondary | ICD-10-CM | POA: Insufficient documentation

## 2024-01-21 DIAGNOSIS — J069 Acute upper respiratory infection, unspecified: Secondary | ICD-10-CM | POA: Insufficient documentation

## 2024-01-22 ENCOUNTER — Emergency Department: Payer: Self-pay

## 2024-01-22 ENCOUNTER — Emergency Department
Admission: EM | Admit: 2024-01-22 | Discharge: 2024-01-22 | Disposition: A | Discharge status: Home / Self Care | Payer: Self-pay | Attending: Emergency Medicine | Admitting: Emergency Medicine

## 2024-01-22 ENCOUNTER — Other Ambulatory Visit: Payer: Self-pay

## 2024-01-22 DIAGNOSIS — J069 Acute upper respiratory infection, unspecified: Secondary | ICD-10-CM

## 2024-01-22 LAB — BASIC METABOLIC PANEL
Anion gap: 11 (ref 5–15)
BUN: 14 mg/dL (ref 6–20)
CO2: 24 mmol/L (ref 22–32)
Calcium: 9.6 mg/dL (ref 8.9–10.3)
Chloride: 104 mmol/L (ref 98–111)
Creatinine, Ser: 0.68 mg/dL (ref 0.44–1.00)
GFR, Estimated: >60 mL/min (ref >60)
Glucose, Bld: 108 mg/dL — ABNORMAL HIGH (ref 70–99)
Potassium: 3.4 mmol/L — ABNORMAL LOW (ref 3.5–5.1)
Sodium: 139 mmol/L (ref 135–145)

## 2024-01-22 LAB — CBC
HCT: 44.1 % (ref 36.0–46.0)
Hemoglobin: 15.1 g/dL — ABNORMAL HIGH (ref 12.0–15.0)
MCH: 29.4 pg (ref 26.0–34.0)
MCHC: 34.2 g/dL (ref 30.0–36.0)
MCV: 85.8 fL (ref 80.0–100.0)
Platelets: 454 10*3/uL — ABNORMAL HIGH (ref 150–400)
RBC: 5.14 MIL/uL — ABNORMAL HIGH (ref 3.87–5.11)
RDW: 12.6 % (ref 11.5–15.5)
WBC: 14.3 10*3/uL — ABNORMAL HIGH (ref 4.0–10.5)
nRBC: 0 % (ref 0.0–0.2)

## 2024-01-22 LAB — RESP PANEL BY RT-PCR (RSV, FLU A&B, COVID)  RVPGX2
Influenza A by PCR: NEGATIVE
Influenza B by PCR: NEGATIVE
Resp Syncytial Virus by PCR: NEGATIVE
SARS Coronavirus 2 by RT PCR: NEGATIVE

## 2024-01-22 LAB — POC URINE PREG, ED: Preg Test, Ur: NEGATIVE

## 2024-01-22 LAB — TROPONIN I (HIGH SENSITIVITY)
Troponin I (High Sensitivity): 3 ng/L (ref <18)
Troponin I (High Sensitivity): 3 ng/L (ref <18)

## 2024-01-22 NOTE — Discharge Instructions (Signed)
You may alternate Tylenol 1000 mg every 6 hours as needed for pain, fever and Ibuprofen 800 mg every 6-8 hours as needed for pain, fever.  Please take Ibuprofen with food.  Do not take more than 4000 mg of Tylenol (acetaminophen) in a 24 hour period.   Please go to the following website to schedule new (and existing) patient appointments:   [email or web address]   The following is a list of primary care offices in the area who are accepting new patients at this time.  Please reach out to one of them directly and let them know you would like to schedule an appointment to follow up on an Emergency Department visit, and/or to establish a new primary care provider (PCP).  There are likely other primary care clinics in the are who are accepting new patients, but this is an excellent place to start:  [facility] physician: Dr Lavon Paganini [address] [place], [place] [address] [phone]  [facility] Lead Physician: Dr Steele Sizer [address], [place], [place] [address] [phone]  [facility] Physician: Dr Park Liter [address] [place], [place] [address] [phone]  [facility] Lead Physician: Dr Dewaine Oats [address] [place], [place] [address] [phone]  [facility] Physician: Dr Halina Maidens [address] [place], [place], [place] [address] [phone]

## 2024-01-22 NOTE — ED Provider Notes (Signed)
 [facility] Provider Note    Event Date/Time   First MD Initiated Contact with Patient 01/22/24 [phone]     (approximate)   History   Shortness of Breath   HPI  [name] is a 28 y.o. female with history of cervical LGSIL who presents to the emergency department with complaints of chest tightness, shortness of breath, nonproductive cough for the past 3 days.  No fevers.  No history of PE, DVT, exogenous estrogen use, recent fractures, surgery, trauma, hospitalization, prolonged travel or other immobilization. No lower extremity swelling or pain. No calf tenderness.  Not having any symptoms currently.   History provided by patient.    Past Medical History:  Diagnosis Date   Anemia    during 2nd pregnancy    Past Surgical History:  Procedure Laterality Date   CESAREAN SECTION  03/31/2023   Procedure: CESAREAN SECTION;  Surgeon: Schermerhorn, Ihor Austin, MD;  Location: [facility] ORS;  Service: Obstetrics;;   CHOLECYSTECTOMY  05/2014   DILATION AND EVACUATION N/A 06/26/2021   Procedure: DILATATION AND EVACUATION;  Surgeon: Christeen Douglas, MD;  Location: [facility] ORS;  Service: Gynecology;  Laterality: N/A;    MEDICATIONS:  Prior to Admission medications   Medication Sig Start Date End Date Taking? Authorizing Provider  acetaminophen (TYLENOL) 500 MG tablet Take 1 tablet (500 mg total) by mouth every 6 (six) hours as needed. 04/02/23   Haroldine Laws, CNM  ibuprofen (ADVIL) 600 MG tablet Take 1 tablet (600 mg total) by mouth every 6 (six) hours as needed. 04/02/23   Haroldine Laws, CNM  Prenatal Vit-Fe Fumarate-FA (PRENATAL MULTIVITAMIN) TABS tablet Take 1 tablet by mouth daily at 12 noon. 12/04/22   Lenice Llamas, FNP    Physical Exam   Triage Vital Signs: ED Triage Vitals  Encounter Vitals Group     BP 01/22/24 0007 (!) 117/90     Systolic BP Percentile --      Diastolic BP Percentile --      Pulse Rate 01/22/24 0007 (!) 114     Resp  01/22/24 0007 18     Temp 01/22/24 0007 98.9 F (37.2 C)     Temp Source 01/22/24 0007 Oral     SpO2 01/22/24 0007 100 %     Weight 01/22/24 0006 160 lb (72.6 kg)     Height 01/22/24 0006 5\' 4"  (1.626 m)     Head Circumference --      Peak Flow --      Pain Score 01/22/24 0006 5     Pain Loc --      Pain Education --      Exclude from Growth Chart --     Most recent vital signs: Vitals:   01/22/24 0007 01/22/24 0503  BP: (!) 117/90 124/64  Pulse: (!) 114 88  Resp: 18 16  Temp: 98.9 F (37.2 C) 98.4 F (36.9 C)  SpO2: 100% 99%    CONSTITUTIONAL: Alert, responds appropriately to questions. Well-appearing; well-nourished HEAD: Normocephalic, atraumatic EYES: Conjunctivae clear, pupils appear equal, sclera nonicteric ENT: normal nose; moist mucous membranes NECK: Supple, normal ROM CARD: RRR; S1 and S2 appreciated RESP: Normal chest excursion without splinting or tachypnea; breath sounds clear and equal bilaterally; no wheezes, no rhonchi, no rales, no hypoxia or respiratory distress, speaking full sentences ABD/GI: Non-distended; soft, non-tender, no rebound, no guarding, no peritoneal signs BACK: The back appears normal EXT: Normal ROM in all joints; no deformity noted, no edema, no calf tenderness or calf  swelling SKIN: Normal color for age and race; warm; no rash on exposed skin NEURO: Moves all extremities equally, normal speech PSYCH: The patient's mood and manner are appropriate.   ED Results / Procedures / Treatments   LABS: (all labs ordered are listed, but only abnormal results are displayed) Labs Reviewed  BASIC METABOLIC PANEL - Abnormal; Notable for the following components:      Result Value   Potassium 3.4 (*)    Glucose, Bld 108 (*)    All other components within normal limits  CBC - Abnormal; Notable for the following components:   WBC 14.3 (*)    RBC 5.14 (*)    Hemoglobin 15.1 (*)    Platelets 454 (*)    All other components within normal limits   RESP PANEL BY RT-PCR (RSV, FLU A&B, COVID)  RVPGX2  POC URINE PREG, ED  TROPONIN I (HIGH SENSITIVITY)  TROPONIN I (HIGH SENSITIVITY)     EKG:  EKG Interpretation Date/Time:  Friday January 22 2024 00:05:44 EST Ventricular Rate:  109 PR Interval:  126 QRS Duration:  76 QT Interval:  328 QTC Calculation: 441 R Axis:   67  Text Interpretation: Sinus tachycardia Otherwise normal ECG When compared with ECG of 27-Feb-2022 22:21, No significant change was found Confirmed by Rochele Raring [phone]) on 01/22/2024 4:42:29 AM         RADIOLOGY: My personal review and interpretation of imaging: Chest x-ray clear.  I have personally reviewed all radiology reports.   DG Chest 2 View Result Date: 01/22/2024 CLINICAL DATA:  Chest pain and shortness of breath with cough and congestion EXAM: CHEST - 2 VIEW COMPARISON:  None Available. FINDINGS: The heart size and mediastinal contours are within normal limits. Both lungs are clear. The visualized skeletal structures are unremarkable. IMPRESSION: No active cardiopulmonary disease. Electronically Signed   By: Minerva Fester M.D.   On: 01/22/2024 01:30     PROCEDURES:  Critical Care performed: No     Procedures    IMPRESSION / MDM / ASSESSMENT AND PLAN / ED COURSE  I reviewed the triage vital signs and the nursing notes.    Patient here with chest pain, shortness of breath, cough.  Now asymptomatic.    DIFFERENTIAL DIAGNOSIS (includes but not limited to):   Viral URI, pneumonia, bronchospasm, bronchitis, PE, doubt ACS, CHF, dissection, pneumothorax   Patient's presentation is most consistent with acute presentation with potential threat to life or bodily function.   PLAN: Workup initiated from triage.  Patient has a leukocytosis of 14,000.  This does appear chronic for patient.  Hemoglobin of 15.  Normal electrolytes.  Troponin x 2 negative.  Pregnancy test negative.  Chest x-ray reviewed and interpreted by myself and the  radiologist and is clear.  COVID, flu and RSV negative.  EKG nonischemic.  She has no risk factors for PE and heart rate now in the 80s and she is asymptomatic.  Low suspicion for PE today.  Suspect viral illness causing her symptoms.  No indication for antibiotics.  Discussed supportive care instructions, return precautions.  Patient comfortable with this plan and eager for discharge home.   MEDICATIONS GIVEN IN ED: Medications - No data to display   ED COURSE:  At this time, I do not feel there is any life-threatening condition present. I reviewed all nursing notes, vitals, pertinent previous records.  All lab and urine results, EKGs, imaging ordered have been independently reviewed and interpreted by myself.  I reviewed all available radiology  reports from any imaging ordered this visit.  Based on my assessment, I feel the patient is safe to be discharged home without further emergent workup and can continue workup as an outpatient as needed. Discussed all findings, treatment plan as well as usual and customary return precautions.  They verbalize understanding and are comfortable with this plan.  Outpatient follow-up has been provided as needed.  All questions have been answered.    CONSULTS:  none   OUTSIDE RECORDS REVIEWED:  Reviewed oncology note on 10/27/2023 for LGSIL.       FINAL CLINICAL IMPRESSION(S) / ED DIAGNOSES   Final diagnoses:  Viral URI with cough     Rx / DC Orders   ED Discharge Orders     None        Note:  This document was prepared using Dragon voice recognition software and may include unintentional dictation errors.   Sandralee Tarkington, Layla Maw, DO 01/22/24 [phone]

## 2024-01-22 NOTE — ED Triage Notes (Signed)
 Pt reports chest pain sob cough and congestion x few days.

## 2024-03-30 ENCOUNTER — Ambulatory Visit: Payer: Self-pay | Admitting: Family Medicine

## 2024-03-30 VITALS — BP 104/67 | HR 94 | Wt 169.0 lb

## 2024-03-30 DIAGNOSIS — Z3044 Encounter for surveillance of vaginal ring hormonal contraceptive device: Secondary | ICD-10-CM

## 2024-03-30 DIAGNOSIS — Z3046 Encounter for surveillance of implantable subdermal contraceptive: Secondary | ICD-10-CM

## 2024-03-30 DIAGNOSIS — Z2009 Contact with and (suspected) exposure to other intestinal infectious diseases: Secondary | ICD-10-CM

## 2024-03-30 MED ORDER — ETONOGESTREL-ETHINYL ESTRADIOL 0.12-0.015 MG/24HR VA RING
VAGINAL_RING | VAGINAL | Status: AC
Start: 2024-03-30 — End: ?

## 2024-03-30 NOTE — Progress Notes (Signed)
 Pt is here for Nexplanon  removal and BCM. Nexplanon  removed successfully from the Lt arm by Tempie Fee, MD. Patient tolerated well to removal process with no complications.

## 2024-03-30 NOTE — Progress Notes (Signed)
 [employer] HEALTH DEPARTMENT [facility] [address], [address] [place] [address] Main phone: [phone]  Family Planning Visit - Repeat Yearly Visit  Subjective:  [name] is a 28 y.o. [id number]  being seen today for an annual wellness visit and to discuss contraception options. The patient is currently using hormonal implant for pregnancy prevention. Patient does not want a pregnancy in the next year.   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   Postpartum endometritis 04/03/23 06/12/2023   Delivery by cesarean section for breech presentation 04/04/2023   Postpartum fever 04/03/2023   Status post primary low transverse cesarean section 04/02/2023   Abnormal glucose tolerance test during pregnancy, antepartum 02/09/2023   Missed prenatal care x ~10 weeks: noncompliant with prenatal care 02/06/2023   Chlamydia Infection  10/21/22 10/15/2022   Supervision of other normal pregnancy, antepartum 10/09/2022   History of miscarriage, currently pregnant 06/14/2021   Overweight BMI=27.3 12/21/2020   Gall stones 03/22/2014   Chief Complaint  Patient presents with   Acute Visit    Pt is here for Nexplanon  removal and change BCM   HPI Patient reports she would like her Nexplanon  out and switch to an IUD eventually. She would like to have an IUD placed another day, so desires a bridge method. Nexplanon  placed after delivering her fourth child one year ago in May 2024. Patient denies HTN, migraine with aura, stroke, DVT, PE, cancer, or congenital clotting disorder.   Review of Systems  Constitutional:  Negative for fever, malaise/fatigue and weight loss.  Respiratory:  Negative for  shortness of breath.   Cardiovascular:  Negative for chest pain and palpitations.   See flowsheet for further details and programmatic requirements Hyperlink available at the top of the signed note in blue.  Flow sheet content below:  Pregnancy Intention Screening Does the patient want to become pregnant in the next year?: No Does the patient's partner want to become pregnant in the next year?: No Would the patient like to discuss contraceptive options today?: Yes Results Follow up Password: [id number] Risk Factors for Hep B Household, sexual, or needle sharing contact of a person infected with Hep B: (P) No Sexual contact with a person who uses drugs not as prescribed?: (P) No Currently or Ever used drugs not as prescribed: (P) No HIV Positive: (P) No PRep Patient: (P) No Men who have sex with men: (P) No Have Hepatitis C: (P) No History of Incarceration: (P) No History of Homeslessness?: (P) No Anal sex following anal drug use?: (P) No Risk Factors for Hep C Currently using drugs not as prescribed: (P) No Sexual partner(s) currently using drugs as not prescribed: (P) No History of drug use: (P) No HIV Positive: (P) No People with a history of incarceration: (P) No People born between the years of 33 and 70: (P) No Contraception Wrap Up Current Method: Hormonal Implant  Diabetes screening This patient is 28 y.o. with a BMI of Body mass index is 29.01 kg/m.Aaron Aas  Is patient eligible for diabetes screening (age >35 and BMI >25)?  no  Was Hgb A1c ordered? not applicable  STI screening Patient reports 1 of partners in last year.  Does this patient desire STI screening?  No -  declines  Hepatitis C screening Has patient been screened once for HCV in the past?  No  No results found for: "HCVAB"  Does the patient meet criteria for HCV testing? No   Hepatitis B screening Does the patient meet criteria for HBV testing? 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 ENDOCERVIX, CURETTAGE:   ...   06/12/2023 IGP, rfx Aptima HPV ASCU DIAGNOSIS:: Comment (A) PAP Reflex: Comment Specimen adequacy:: Comment Clinician Provided ICD10: Comment Performed by:: Comment Electronically signed by:: Comment PAP Smear Comment: . PATHOLOGIST PROVIDED ICD10:: Comment Note:: Comment Test Methodology: Comment   03/29/2020 Pap IG (Image Guided) DIAGNOSIS:: Comment Specimen adequacy:: Comment Clinician Provided ICD10: Comment Performed by:: Comment QC reviewed by:: Comment PAP Smear Comment: . Note:: Comment Test Methodology: Comment    Health Maintenance Due  Topic Date Due   HPV VACCINES (1 - 3-dose series) Never done   DTaP/Tdap/Td (1 - Tdap) Never done   COVID-19 Vaccine (1 - 2024-25 season) Never done   The following portions of the patient's history were reviewed and updated as appropriate: allergies, current medications, past family history, past medical history, past social history, past surgical history and problem list. Problem list updated.  Objective:   Vitals:   03/30/24 1438  BP: 104/67  Pulse: 94  Weight: 169 lb (76.7 kg)   Physical Exam Vitals and nursing note reviewed.  Constitutional:      Appearance: Normal appearance.  HENT:     Head: Normocephalic.     Mouth/Throat:     Mouth: Mucous membranes are moist.  Eyes:     General: No scleral icterus.       Right eye: No discharge.        Left eye: No discharge.     Conjunctiva/sclera: Conjunctivae normal.  Pulmonary:     Effort: Pulmonary effort is normal.  Musculoskeletal:        General: Normal range of motion.     Cervical back: Neck supple. No rigidity or tenderness.  Lymphadenopathy:     Head:      Right side of head: No submandibular, preauricular or posterior auricular adenopathy.     Left side of head: No submandibular, preauricular or posterior auricular adenopathy.     Cervical: No cervical adenopathy.     Right cervical: No superficial or posterior cervical adenopathy.    Left cervical: No superficial or posterior cervical adenopathy.  Skin:    General: Skin is warm and dry.     Capillary Refill: Capillary refill takes less than 2 seconds.     Coloration: Skin is not jaundiced or pale.     Findings: No bruising, erythema, lesion or rash.  Neurological:     General: No focal deficit present.     Mental Status: She is alert and oriented to person, place, and time.  Psychiatric:        Mood and Affect: Mood normal.        Behavior: Behavior normal.    Procedure:  Nexplanon  Removal Patient identified, informed consent performed, consent signed.   Appropriate time out taken. Nexplanon  site identified.  Area prepped in usual sterile fashon. 3 ml of 1% lidocaine  with Epinephrine  was used to anesthetize the area at the distal end  of the implant and along implant site. A small stab incision was made right beside the implant on the distal portion.  The Nexplanon  rod was grasped using hemostats and removed without difficulty.  There was minimal blood loss. There were no complications.  Steri-strips were applied over the small incision.  A pressure bandage was applied to reduce any bruising.  The patient tolerated the procedure well and was given post procedure instructions.     Assessment and Plan:   [name] is a 28 y.o. female [phone] presenting to the [facility] Department for an yearly wellness and contraception visit  Contraception counseling:  Reviewed options based on patient desire and reproductive life plan. Patient is interested in Vaginal Ring. This was provided to the patient today.   Risks, benefits, and typical effectiveness rates were  reviewed.  Questions were answered.  Written information was also given to the patient to review.    The patient will follow up in  1 months for surveillance.  The patient was told to call with any further questions, or with any concerns about this method of contraception.  Emphasized use of condoms 100% of the time for STI prevention.  Emergency Contraception Precautions (ECP): Patient assessed for need of ECP. She is not a candidate based on LARC in place and unexpired .   Encounter for surveillance of vaginal ring hormonal contraceptive device -     Etonogestrel -Ethinyl Estradiol ; Insert vaginally and leave in place for 3 consecutive weeks, then remove for 1 week.  Nexplanon  removal   Return in about 4 weeks (around 04/27/2024).  No future appointments.  Jack Marts, MD

## 2024-05-21 ENCOUNTER — Emergency Department: Payer: Self-pay

## 2024-05-21 ENCOUNTER — Other Ambulatory Visit: Payer: Self-pay

## 2024-05-21 ENCOUNTER — Emergency Department
Admission: EM | Admit: 2024-05-21 | Discharge: 2024-05-21 | Disposition: A | Discharge status: Home / Self Care | Payer: Self-pay | Attending: Emergency Medicine | Admitting: Emergency Medicine

## 2024-05-21 DIAGNOSIS — Z23 Encounter for immunization: Secondary | ICD-10-CM | POA: Insufficient documentation

## 2024-05-21 DIAGNOSIS — S61211A Laceration without foreign body of left index finger without damage to nail, initial encounter: Secondary | ICD-10-CM | POA: Insufficient documentation

## 2024-05-21 DIAGNOSIS — W260XXA Contact with knife, initial encounter: Secondary | ICD-10-CM | POA: Insufficient documentation

## 2024-05-21 MED ORDER — TETANUS-DIPHTH-ACELL PERTUSSIS 5-2.5-18.5 LF-MCG/0.5 IM SUSY
0.5000 mL | PREFILLED_SYRINGE | Freq: Once | INTRAMUSCULAR | Status: AC
Start: 2024-05-21 — End: 2024-05-21
  Administered 2024-05-21: 0.5 mL via INTRAMUSCULAR
  Filled 2024-05-21: qty 0.5

## 2024-05-21 MED ORDER — LIDOCAINE HCL (PF) 1 % IJ SOLN
5.0000 mL | Freq: Once | INTRAMUSCULAR | Status: AC
  Administered 2024-05-21: 5 mL via INTRADERMAL
  Filled 2024-05-21: qty 5

## 2024-05-21 NOTE — ED Notes (Signed)
 Pt does not know the last time she had a tetanus shot.

## 2024-05-21 NOTE — ED Triage Notes (Signed)
 Pt to ED via POV from home. Pt reports was cutting an avocado and sliced left pointer finger. Wound bleeding and wrapped at this time. Unsure of tetanus.

## 2024-05-21 NOTE — ED Provider Notes (Signed)
 [facility] Provider Note    Event Date/Time   First MD Initiated Contact with Patient 05/21/24 1155     (approximate)   History   Laceration   HPI  [name] is a 28 y.o. female with no significant past medical history presents emergency department with complaints of a laceration to the left index finger.  Patient was using a knife earlier today to cut a avocado.  States the knife slipped and cut her finger.  Unsure of her last Tdap.  No numbness or tingling      Physical Exam   Triage Vital Signs: ED Triage Vitals  Encounter Vitals Group     BP 05/21/24 1112 (!) 151/106     Girls Systolic BP Percentile --      Girls Diastolic BP Percentile --      Boys Systolic BP Percentile --      Boys Diastolic BP Percentile --      Pulse Rate 05/21/24 1112 93     Resp 05/21/24 1112 19     Temp 05/21/24 1112 99 F (37.2 C)     Temp Source 05/21/24 1112 Oral     SpO2 05/21/24 1112 99 %     Weight 05/21/24 1114 167 lb 8.8 oz (76 kg)     Height 05/21/24 1114 5' 4 (1.626 m)     Head Circumference --      Peak Flow --      Pain Score 05/21/24 1114 10     Pain Loc --      Pain Education --      Exclude from Growth Chart --     Most recent vital signs: Vitals:   05/21/24 1112  BP: (!) 151/106  Pulse: 93  Resp: 19  Temp: 99 F (37.2 C)  SpO2: 99%     General: Awake, no distress.   CV:  Good peripheral perfusion Resp:  Normal effort. Abd:  No distention.   Other:  Left index finger with a laceration at the proximal phalanx, approximately 3 cm, no foreign body, no tendon injury noted, patient does have full range of motion   ED Results / Procedures / Treatments   Labs (all labs ordered are listed, but only abnormal results are displayed) Labs Reviewed - No data to display   EKG     RADIOLOGY X-ray left index finger    PROCEDURES:   .Laceration Repair  Date/Time: 05/21/2024 1:26 PM  Performed by: Gasper Devere ORN, PA-C Authorized by: Gasper Devere ORN, PA-C   Consent:    Consent obtained:  Verbal   Consent given by:  Patient   Risks, benefits, and alternatives were discussed: yes     Risks discussed:  Infection, pain, retained foreign body, tendon damage, poor cosmetic result, need for additional repair, nerve damage, poor wound healing and vascular damage   Alternatives discussed:  Delayed treatment Universal protocol:    Procedure explained and questions answered to patient or proxy's satisfaction: yes     Immediately prior to procedure, a time out was called: yes     Patient identity confirmed:  Verbally with patient Anesthesia:    Anesthesia method:  Nerve block   Block location:  Left index finger   Block needle gauge:  27 G   Block anesthetic:  Lidocaine  1% w/o epi   Block technique:  Digital   Block injection procedure:  Anatomic landmarks identified, introduced needle, incremental injection, anatomic landmarks palpated and negative aspiration for  blood   Block outcome:  Anesthesia achieved Laceration details:    Location:  Finger   Finger location:  L index finger   Length (cm):  3 Pre-procedure details:    Preparation:  Patient was prepped and draped in usual sterile fashion and imaging obtained to evaluate for foreign bodies Exploration:    Limited defect created (wound extended): no     Hemostasis achieved with:  Direct pressure   Imaging obtained: x-ray     Imaging outcome: foreign body not noted     Wound exploration: wound explored through full range of motion and entire depth of wound visualized     Wound extent: areolar tissue not violated, fascia not violated, no foreign body, no signs of injury, no nerve damage, no tendon damage, no underlying fracture and no vascular damage     Contaminated: no   Treatment:    Area cleansed with:  Povidone-iodine  and saline   Amount of cleaning:  Standard   Irrigation solution:  Sterile saline   Irrigation method:  Syringe and  tap   Debridement:  None   Undermining:  None   Scar revision: no   Skin repair:    Repair method:  Sutures   Suture size:  5-0   Suture material:  Nylon   Suture technique:  Simple interrupted   Number of sutures:  8 Approximation:    Approximation:  Close Repair type:    Repair type:  Simple Post-procedure details:    Dressing:  Non-adherent dressing   Procedure completion:  Tolerated well, no immediate complications   Critical Care:  no Chief Complaint  Patient presents with   Laceration      MEDICATIONS ORDERED IN ED: Medications  lidocaine  (PF) (XYLOCAINE ) 1 % injection 5 mL (5 mLs Intradermal Given 05/21/24 1259)  Tdap (BOOSTRIX) injection 0.5 mL (0.5 mLs Intramuscular Given 05/21/24 1257)     IMPRESSION / MDM / ASSESSMENT AND PLAN / ED COURSE  I reviewed the triage vital signs and the nursing notes.                              Differential diagnosis includes, but is not limited to, laceration, abrasion, fracture, tendon injury  Patient's presentation is most consistent with acute complicated illness / injury requiring diagnostic workup.    Medications given: Tdap updated, Xylocaine  1% for digital block  X-ray of the left index finger was independently reviewed interpreted by me as being negative for any acute abnormality, confirmed by radiology  Tdap updated  See procedure note for laceration repair  The wound was very clean do not feel the patient needs antibiotic at this time.  She is to have sutures removed in 7 to 10 days.  She can see her regular doctor, go to urgent care, return emergency department or remove them herself.  She is in agreement treatment plan.  Patient was discharged stable condition.      FINAL CLINICAL IMPRESSION(S) / ED DIAGNOSES   Final diagnoses:  Laceration of left index finger without foreign body without damage to nail, initial encounter     Rx / DC Orders   ED Discharge Orders     None        Note:  This  document was prepared using Dragon voice recognition software and may include unintentional dictation errors.    Gasper Devere ORN, PA-C 05/21/24 1340    Waymond Lorelle Cummins, MD 05/21/24 [phone]

## 2024-05-21 NOTE — Discharge Instructions (Signed)
 Keep the areas clean and dry as possible.  Sutures should be removed in 7 to 10 days.  You may see your regular doctor, return the emergency department, go to urgent care, or remove them yourself.  If you see any sign of infection which would be redness pus swelling or increased pain then return the emergency department.  Take Tylenol  or ibuprofen  for pain as needed.  Your Tdap was updated today and is good for 10 years.

## 2024-08-25 ENCOUNTER — Encounter: Payer: Self-pay | Admitting: Emergency Medicine

## 2024-08-25 ENCOUNTER — Emergency Department: Payer: Self-pay

## 2024-08-25 ENCOUNTER — Other Ambulatory Visit: Payer: Self-pay

## 2024-08-25 DIAGNOSIS — O200 Threatened abortion: Secondary | ICD-10-CM | POA: Insufficient documentation

## 2024-08-25 DIAGNOSIS — Z3A01 Less than 8 weeks gestation of pregnancy: Secondary | ICD-10-CM | POA: Insufficient documentation

## 2024-08-25 LAB — CBC WITH DIFFERENTIAL/PLATELET
Abs Immature Granulocytes: 0.08 K/uL — ABNORMAL HIGH (ref 0.00–0.07)
Basophils Absolute: 0.1 K/uL (ref 0.0–0.1)
Basophils Relative: 0 %
Eosinophils Absolute: 0.8 K/uL — ABNORMAL HIGH (ref 0.0–0.5)
Eosinophils Relative: 5 %
HCT: 40.7 % (ref 36.0–46.0)
Hemoglobin: 14.3 g/dL (ref 12.0–15.0)
Immature Granulocytes: 1 %
Lymphocytes Relative: 19 %
Lymphs Abs: 3 K/uL (ref 0.7–4.0)
MCH: 29.7 pg (ref 26.0–34.0)
MCHC: 35.1 g/dL (ref 30.0–36.0)
MCV: 84.6 fL (ref 80.0–100.0)
Monocytes Absolute: 0.9 K/uL (ref 0.1–1.0)
Monocytes Relative: 6 %
Neutro Abs: 11.1 K/uL — ABNORMAL HIGH (ref 1.7–7.7)
Neutrophils Relative %: 69 %
Platelets: 437 K/uL — ABNORMAL HIGH (ref 150–400)
RBC: 4.81 MIL/uL (ref 3.87–5.11)
RDW: 12.9 % (ref 11.5–15.5)
WBC: 16 K/uL — ABNORMAL HIGH (ref 4.0–10.5)
nRBC: 0 % (ref 0.0–0.2)

## 2024-08-25 LAB — COMPREHENSIVE METABOLIC PANEL WITH GFR
ALT: 70 U/L — ABNORMAL HIGH (ref 0–44)
AST: 35 U/L (ref 15–41)
Albumin: 4.1 g/dL (ref 3.5–5.0)
Alkaline Phosphatase: 118 U/L (ref 38–126)
Anion gap: 14 (ref 5–15)
BUN: 10 mg/dL (ref 6–20)
CO2: 21 mmol/L — ABNORMAL LOW (ref 22–32)
Calcium: 9.5 mg/dL (ref 8.9–10.3)
Chloride: 101 mmol/L (ref 98–111)
Creatinine, Ser: 0.46 mg/dL (ref 0.44–1.00)
GFR, Estimated: >60 mL/min (ref >60)
Glucose, Bld: 93 mg/dL (ref 70–99)
Potassium: 3.2 mmol/L — ABNORMAL LOW (ref 3.5–5.1)
Sodium: 136 mmol/L (ref 135–145)
Total Bilirubin: 0.4 mg/dL (ref 0.0–1.2)
Total Protein: 7.5 g/dL (ref 6.5–8.1)

## 2024-08-25 LAB — URINALYSIS, ROUTINE W REFLEX MICROSCOPIC
Bacteria, UA: NONE SEEN
Bilirubin Urine: NEGATIVE
Glucose, UA: NEGATIVE mg/dL
Ketones, ur: NEGATIVE mg/dL
Nitrite: NEGATIVE
Protein, ur: NEGATIVE mg/dL
Specific Gravity, Urine: 1.008 (ref 1.005–1.030)
pH: 6 (ref 5.0–8.0)

## 2024-08-25 LAB — POC URINE PREG, ED: Preg Test, Ur: POSITIVE — AB

## 2024-08-25 LAB — HCG, QUANTITATIVE, PREGNANCY: hCG, Beta Chain, Quant, S: 25216 m[IU]/mL — ABNORMAL HIGH (ref <5)

## 2024-08-25 NOTE — ED Notes (Signed)
 Green and lav top tubes and urine sample collected and sent to lab

## 2024-08-25 NOTE — ED Triage Notes (Addendum)
 Pt arrives POV, ambulatory to triage, gait steady, no acute distress noted, c/o approximately being [redacted] weeks pregnant and having lower abd pain w/ small amount of vaginal bleeding that started yesterday. LMP 07/13/24, G-6 P-4

## 2024-08-26 ENCOUNTER — Encounter: Payer: Self-pay | Admitting: Radiology

## 2024-08-26 ENCOUNTER — Emergency Department
Admission: EM | Admit: 2024-08-26 | Discharge: 2024-08-26 | Disposition: A | Discharge status: Home / Self Care | Payer: Self-pay | Attending: Emergency Medicine | Admitting: Emergency Medicine

## 2024-08-26 DIAGNOSIS — O200 Threatened abortion: Secondary | ICD-10-CM

## 2024-08-26 NOTE — Discharge Instructions (Addendum)
 You have been seen in the Emergency Department (ED) for vaginal bleeding during pregnancy, which is called a "threatened miscarriage" or threatened abortion.  However, the blood work and ultrasound cannot say for sure that this pregnancy is going to develop into a normal fetus.  It may be too early to know for sure, or it may be that the pregnancy is not going to develop.  You need to follow-up as an outpatient for a repeat blood test called a beta hCG and a repeat ultrasound in order to determine if this is a pregnancy that will never develop, a miscarriage, or a fetus that will develop normally.  Finding out that you are pregnant when you had not intended to be can be unsettling. Many people find that even with all the talk about termination of pregnancy (also called abortion) in the media, they actually don't know much about it on a basic level. Having more information can help you make more informed decisions, and can also help you know that you are not alone when facing these decisions.  Below are some web sites with good clinical data about all aspects of unintended pregnancy and abortion.  Whatever you end up deciding, it is important to know that either way it is your choice. You get to make it. You get to decide what is right for your life, in this situation, at this time in your life. You can make thoughtful decisions and you will be ok, no matter what you decide. And maybe feel a little empowered at the end of all of it, too!   Some quick (and possibly surprising) facts: 1/2 of all pregnancies in the [place]  are unintended (yes, even these days, even though we have good contraceptive options and in general expect to be in control of our own bodies, half of all pregnancies!! This means that you are NOT ALONE.) Of the unintended pregnancies, about 1/3 to 1/2 end in termination. The overall abortion rate in the [place]  is going down (possibly because of more effective contraception options) 1 out of 3  American women will need and have an abortion in her lifetime.    Resources:   [employer]necologists:  https://www.boyd-meyer.org/  [facility] (Women's health care provider) Https://www.plannedparenthood.org/learn/abortion  National Glass blower/designer (Professional association of abortion providers) Good FAQs available in Web designer. Abortion providers in your area can be found here. StickerEmporium.tn  [facility]: [phone] (goes up to 12 wks in keeping with the new [place] law)  [facility]: [phone] (goes up to 12 wks in keeping with the new  law)   From [employer] Abortion Federation:  [facility] of [place], [place], [place] [phone] https://awcgreensboro.com  [facility] [place], [place] [phone] www.[facility].com  Online: https://www.plancpills.org/guide-how-to-get-abortion-pills I Need an Abortion  ineedana.com  Abortion, self managed  Jen Gunters essay with dosing, instructions, diagrams and references.  https://vajenda.substack.com/p/your-medical-team-cannot-tell-if?r=6h6i4&s=r&utm_campaign=post&utm_medium=email&fbclid=[email or web address]  Where to get mifepristone and misoprostol  A safe, at-home abortion is here Plan C provides up-to-date information on how people in the [place] are accessing abortion pills online. https://www.plancpills.org/?fbclid=[email or web address]_[email or web address]_[email or web address]  https://carafem.org/at-home-abortion-pills/ This service is currently available in [place] , [place] , [place] , the [address] of [place], [place] , [place] , [place] , [place] , [place] , [place] , [place] , [place] , [address], [place] , and [place] .  If you live in [place] , [place] , [place] , or [place] , or can travel to those states, [facility] is proud to provide compassionate, quality medication abortion care by mail. https://powell.info/  Overseas mailing mife and miso to patients: Aid Access. This is a good resource. https://aidaccess.org/en/   https://www.abortionfinder.org/ - you can find the laws in your state here.  TourNames.dk - [place] and [place] resources

## 2024-08-26 NOTE — ED Provider Notes (Signed)
 [facility] Provider Note    Event Date/Time   First MD Initiated Contact with Patient 08/26/24 0022     (approximate)   History   Abdominal Pain and Vaginal Bleeding   HPI [name] is a 28 y.o. female  G6 P4 Ab 1 who presents for evaluation of minor lower abdominal pain/cramping and some vaginal spotting.  LMP was about 6 weeks ago.  Patient had positive home pregnancy test within the last week.  Symptoms have improved since coming to the ED.  No prenatal care.  No fever, dysuria, N/V.  No recent trauma.      Physical Exam   Triage Vital Signs: ED Triage Vitals  Encounter Vitals Group     BP 08/25/24 2247 110/74     Girls Systolic BP Percentile --      Girls Diastolic BP Percentile --      Boys Systolic BP Percentile --      Boys Diastolic BP Percentile --      Pulse Rate 08/25/24 2247 77     Resp 08/25/24 2247 18     Temp 08/25/24 2247 98.7 F (37.1 C)     Temp Source 08/25/24 2247 Oral     SpO2 08/25/24 2247 97 %     Weight 08/25/24 2244 72.6 kg (160 lb)     Height 08/25/24 2244 1.524 m (5')     Head Circumference --      Peak Flow --      Pain Score 08/25/24 2244 5     Pain Loc --      Pain Education --      Exclude from Growth Chart --     Most recent vital signs: Vitals:   08/25/24 2247  BP: 110/74  Pulse: 77  Resp: 18  Temp: 98.7 F (37.1 C)  SpO2: 97%    General: Awake, no distress.  CV:  Good peripheral perfusion.  Resp:  Normal effort. Speaking easily and comfortably, no accessory muscle usage nor intercostal retractions.   Abd:  No distention.  Soft with no tenderness. Other:  Deferred pelvic exam as there is no acute medical reason to perform 1.   ED Results / Procedures / Treatments   Labs (all labs ordered are listed, but only abnormal results are displayed) Labs Reviewed  CBC WITH DIFFERENTIAL/PLATELET - Abnormal; Notable for the following components:      Result Value   WBC 16.0 (*)     Platelets 437 (*)    Neutro Abs 11.1 (*)    Eosinophils Absolute 0.8 (*)    Abs Immature Granulocytes 0.08 (*)    All other components within normal limits  URINALYSIS, ROUTINE W REFLEX MICROSCOPIC - Abnormal; Notable for the following components:   Color, Urine YELLOW (*)    APPearance HAZY (*)    Hgb urine dipstick SMALL (*)    Leukocytes,Ua SMALL (*)    All other components within normal limits  HCG, QUANTITATIVE, PREGNANCY - Abnormal; Notable for the following components:   hCG, Beta Chain, Quant, S 25,216 (*)    All other components within normal limits  COMPREHENSIVE METABOLIC PANEL WITH GFR - Abnormal; Notable for the following components:   Potassium 3.2 (*)    CO2 21 (*)    ALT 70 (*)    All other components within normal limits  POC URINE PREG, ED - Abnormal; Notable for the following components:   Preg Test, Ur Positive (*)    All other  components within normal limits      RADIOLOGY I independently viewed and interpreted the patient's ultrasound and I can see a gestational sac but nothing else.  Radiology confirmed gestational sac at about 6 weeks but no visualizable fetus.  No subchorionic hemorrhage.   PROCEDURES:  Critical Care performed: No  Procedures    IMPRESSION / MDM / ASSESSMENT AND PLAN / ED COURSE  I reviewed the triage vital signs and the nursing notes.                              Differential diagnosis includes, but is not limited to, threatened miscarriage, incomplete miscarriage, normal bleeding from an early trimester pregnancy, ectopic pregnancy, , blighted ovum, vaginal/cervical trauma, subchorionic hemorrhage/hematoma, etc.   Patient's presentation is most consistent with acute presentation with potential threat to life or bodily function.  Labs/studies ordered: Urine pregnancy test, hCG, urinalysis, CBC with differential, CMP, OB ultrasound  Interventions/Medications given:  Medications - No data to display  (Note:  hospital  course my include additional interventions and/or labs/studies not listed above.)   Patient's vitals are stable and symptoms are minimal.  Ultrasound is nondiagnostic likely because pregnancy is too early but also may be nonviable.  No indication of ectopic or other emergent condition.  Of note, the patient volunteered the information that this is an unplanned pregnancy and that she would like to terminate the pregnancy.  She has plans to call tomorrow to schedule an appointment to consult about this process.  Without advocating either course of action, I ask her if she would like some additional resources about her options and she said yes so I provided her with materials about her options and how to safely proceed with what ever course of action she chooses to take.  I also pointed out to her that she can follow-up with her regular doctor or with an OB/GYN for additional evaluation.  She has had 4 children and understands the process of prenatal care and how to establish care if she would like to do so.  I gave my usual and customary follow-up recommendations and return precautions.       FINAL CLINICAL IMPRESSION(S) / ED DIAGNOSES   Final diagnoses:  Threatened miscarriage     Rx / DC Orders   ED Discharge Orders     None        Note:  This document was prepared using Dragon voice recognition software and may include unintentional dictation errors.   Gordan Huxley, MD 08/26/24 [phone]

## 2024-10-24 ENCOUNTER — Emergency Department
Admission: EM | Admit: 2024-10-24 | Discharge: 2024-10-24 | Disposition: A | Discharge status: Home / Self Care | Payer: Self-pay | Attending: Emergency Medicine | Admitting: Emergency Medicine

## 2024-10-24 ENCOUNTER — Other Ambulatory Visit: Payer: Self-pay

## 2024-10-24 DIAGNOSIS — R55 Syncope and collapse: Secondary | ICD-10-CM | POA: Insufficient documentation

## 2024-10-24 DIAGNOSIS — Z3A14 14 weeks gestation of pregnancy: Secondary | ICD-10-CM | POA: Insufficient documentation

## 2024-10-24 DIAGNOSIS — Z3491 Encounter for supervision of normal pregnancy, unspecified, first trimester: Secondary | ICD-10-CM

## 2024-10-24 DIAGNOSIS — D72829 Elevated white blood cell count, unspecified: Secondary | ICD-10-CM | POA: Insufficient documentation

## 2024-10-24 DIAGNOSIS — O26892 Other specified pregnancy related conditions, second trimester: Secondary | ICD-10-CM | POA: Insufficient documentation

## 2024-10-24 LAB — COMPREHENSIVE METABOLIC PANEL WITH GFR
ALT: 15 U/L (ref 0–44)
AST: 18 U/L (ref 15–41)
Albumin: 4.3 g/dL (ref 3.5–5.0)
Alkaline Phosphatase: 109 U/L (ref 38–126)
Anion gap: 13 (ref 5–15)
BUN: 7 mg/dL (ref 6–20)
CO2: 21 mmol/L — ABNORMAL LOW (ref 22–32)
Calcium: 9.2 mg/dL (ref 8.9–10.3)
Chloride: 101 mmol/L (ref 98–111)
Creatinine, Ser: 0.43 mg/dL — ABNORMAL LOW (ref 0.44–1.00)
GFR, Estimated: >60 mL/min (ref >60)
Glucose, Bld: 113 mg/dL — ABNORMAL HIGH (ref 70–99)
Potassium: 3.1 mmol/L — ABNORMAL LOW (ref 3.5–5.1)
Sodium: 135 mmol/L (ref 135–145)
Total Bilirubin: 0.3 mg/dL (ref 0.0–1.2)
Total Protein: 7.5 g/dL (ref 6.5–8.1)

## 2024-10-24 LAB — URINALYSIS, ROUTINE W REFLEX MICROSCOPIC
Bilirubin Urine: NEGATIVE
Glucose, UA: NEGATIVE mg/dL
Hgb urine dipstick: NEGATIVE
Ketones, ur: NEGATIVE mg/dL
Nitrite: NEGATIVE
Protein, ur: NEGATIVE mg/dL
Specific Gravity, Urine: 1.026 (ref 1.005–1.030)
pH: 5 (ref 5.0–8.0)

## 2024-10-24 LAB — CBC
HCT: 40 % (ref 36.0–46.0)
Hemoglobin: 13.5 g/dL (ref 12.0–15.0)
MCH: 29.6 pg (ref 26.0–34.0)
MCHC: 33.8 g/dL (ref 30.0–36.0)
MCV: 87.7 fL (ref 80.0–100.0)
Platelets: 390 K/uL (ref 150–400)
RBC: 4.56 MIL/uL (ref 3.87–5.11)
RDW: 13 % (ref 11.5–15.5)
WBC: 15.9 K/uL — ABNORMAL HIGH (ref 4.0–10.5)
nRBC: 0 % (ref 0.0–0.2)

## 2024-10-24 LAB — HCG, QUANTITATIVE, PREGNANCY: hCG, Beta Chain, Quant, S: 30030 m[IU]/mL — ABNORMAL HIGH (ref <5)

## 2024-10-24 LAB — POC URINE PREG, ED: Preg Test, Ur: POSITIVE — AB

## 2024-10-24 MED ORDER — ACETAMINOPHEN 500 MG PO TABS
1000.0000 mg | ORAL_TABLET | Freq: Once | ORAL | Status: AC
  Administered 2024-10-24: 1000 mg via ORAL
  Filled 2024-10-24: qty 2

## 2024-10-24 NOTE — ED Provider Notes (Signed)
 [facility] Provider Note    Event Date/Time   First MD Initiated Contact with Patient 10/24/24 2016     (approximate)  History   Chief Complaint: Loss of Consciousness  HPI  [name] is a 28 y.o. female G4, P4 who presents to the emergency department for a syncopal episode.  According to the patient in October she was told that she likely had a miscarriage.  Patient states she has not followed up with anyone since.  States she was having vaginal bleeding in October but this stopped after approximately 4 days has not had any further bleeding.  Patient states he was at work tonight when she began feeling lightheaded and dizzy, states ultimately she tried to sit down and passed out.  Patient denies any injuries.  Denies any recent illness or fever.  Physical Exam   Triage Vital Signs: ED Triage Vitals  Encounter Vitals Group     BP 10/24/24 1918 109/82     Girls Systolic BP Percentile --      Girls Diastolic BP Percentile --      Boys Systolic BP Percentile --      Boys Diastolic BP Percentile --      Pulse Rate 10/24/24 1918 100     Resp 10/24/24 1918 18     Temp 10/24/24 1918 99.3 F (37.4 C)     Temp src --      SpO2 10/24/24 1918 100 %     Weight 10/24/24 1917 150 lb (68 kg)     Height 10/24/24 1917 4' 9 (1.448 m)     Head Circumference --      Peak Flow --      Pain Score 10/24/24 1917 0     Pain Loc --      Pain Education --      Exclude from Growth Chart --     Most recent vital signs: Vitals:   10/24/24 1918  BP: 109/82  Pulse: 100  Resp: 18  Temp: 99.3 F (37.4 C)  SpO2: 100%    General: Awake, no distress.  CV:  Good peripheral perfusion.  Regular rate and rhythm  Resp:  Normal effort.  Equal breath sounds bilaterally.  Abd:  No distention.  Soft, nontender.  No rebound or guarding.  ED Results / Procedures / Treatments   EKG  EKG viewed and interpreted by myself shows a normal sinus rhythm at 93 bpm  with a narrow QRS, normal axis, normal intervals, no concerning ST changes.   MEDICATIONS ORDERED IN ED: Medications - No data to display   IMPRESSION / MDM / ASSESSMENT AND PLAN / ED COURSE  I reviewed the triage vital signs and the nursing notes.  Patient's presentation is most consistent with acute presentation with potential threat to life or bodily function.  Patient presents emergency department for a syncopal event at work today.  Overall the patient appears well states she is feeling much better, reassuring vital signs reassuring physical exam.  Patient's lab results show a mild leukocytosis of 15,000 reassuring chemistry, reassuring urinalysis.  Patient's urine pregnancy test however is positive.  I performed a bedside ultrasound which shows good fetal movement with a heart rate of 156 bpm.  I reviewed the patient's note from the beginning of October patient had an ultrasound that was nondiagnostic at that time and was told this could either be a miscarriage versus an early pregnancy.  It appears that that was most likely consistent with  an early pregnancy and now the patient is further along.  Given the patient's syncopal episode will obtain a new ultrasound to evaluate fetal health and dates.  Pregnancy could very well be the cause for the patient syncopal event earlier today.  I discussed with the patient plenty of fluids and overall supportive care.  Patient agreeable.  Beta-hCG is resulted at 30,000.  Patient still waiting for ultrasound, states she is ready to go home.  Patient had a reassuring bedside ultrasound with a fetal heart rate of 156 and good fetal movement.  Patient will follow-up with an OB/GYN.  FINAL CLINICAL IMPRESSION(S) / ED DIAGNOSES   Syncope   Note:  This document was prepared using Dragon voice recognition software and may include unintentional dictation errors.   Dorothyann Drivers, MD 10/24/24 2216

## 2024-10-24 NOTE — ED Triage Notes (Addendum)
 Pt reports she was at work and had a syncopal episode. Pt reports she now has a headache and feels dizzy. Pt denies pain at this time. Pt reports she had a miscarriage 2 months ago at aprox [redacted] weeks pregnant. Pt denies vaginal bleeding at this time.

## 2024-10-24 NOTE — Discharge Instructions (Addendum)
 Please follow-up with an OB/GYN as soon as possible.  Based on your prior ultrasound you are approximately 14 weeks and 5 days along with an estimated due date around Apr 19, 2025.  Please drink plenty of fluids.  If you begin feeling lightheaded or dizzy please sit down or lay down immediately.
